# Patient Record
Sex: Male | Born: 1990 | Race: Black or African American | Hispanic: No | Marital: Single | Smoking: Former smoker
Health system: Southern US, Community
[De-identification: ages and names within clinical notes are randomized; demographics above are authoritative.]

## PROBLEM LIST (undated history)

## (undated) DIAGNOSIS — I10 Essential (primary) hypertension: Secondary | ICD-10-CM

## (undated) HISTORY — PX: ANKLE SURGERY: SHX546

---
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
## 2009-02-13 ENCOUNTER — Emergency Department (HOSPITAL_COMMUNITY): Admission: EM | Admit: 2009-02-13 | Discharge: 2009-02-13 | Payer: Self-pay | Admitting: Emergency Medicine

## 2009-12-31 ENCOUNTER — Emergency Department (HOSPITAL_COMMUNITY): Admission: EM | Admit: 2009-12-31 | Discharge: 2009-12-31 | Payer: Self-pay | Admitting: Emergency Medicine

## 2010-07-07 ENCOUNTER — Emergency Department (HOSPITAL_COMMUNITY)
Admission: EM | Admit: 2010-07-07 | Discharge: 2010-07-08 | Payer: Self-pay | Source: Home / Self Care | Admitting: Emergency Medicine

## 2010-07-08 LAB — URINALYSIS, ROUTINE W REFLEX MICROSCOPIC
Bilirubin Urine: NEGATIVE
Hgb urine dipstick: NEGATIVE
Ketones, ur: NEGATIVE mg/dL
Nitrite: NEGATIVE
Protein, ur: NEGATIVE mg/dL
Specific Gravity, Urine: 1.028 (ref 1.005–1.030)
Urine Glucose, Fasting: NEGATIVE mg/dL
Urobilinogen, UA: 1 mg/dL (ref 0.0–1.0)
pH: 6 (ref 5.0–8.0)

## 2010-07-08 LAB — COMPREHENSIVE METABOLIC PANEL
ALT: 33 U/L (ref 0–53)
AST: 31 U/L (ref 0–37)
Albumin: 4.8 g/dL (ref 3.5–5.2)
Alkaline Phosphatase: 105 U/L (ref 39–117)
BUN: 12 mg/dL (ref 6–23)
CO2: 32 mEq/L (ref 19–32)
Calcium: 10 mg/dL (ref 8.4–10.5)
Chloride: 98 mEq/L (ref 96–112)
Creatinine, Ser: 1.15 mg/dL (ref 0.4–1.5)
GFR calc Af Amer: >60 mL/min (ref >60)
GFR calc non Af Amer: >60 mL/min (ref >60)
Glucose, Bld: 89 mg/dL (ref 70–99)
Potassium: 4.1 mEq/L (ref 3.5–5.1)
Sodium: 139 mEq/L (ref 135–145)
Total Bilirubin: 3.3 mg/dL — ABNORMAL HIGH (ref 0.3–1.2)
Total Protein: 8.3 g/dL (ref 6.0–8.3)

## 2010-07-08 LAB — POCT CARDIAC MARKERS
CKMB, poc: <1 ng/mL — ABNORMAL LOW (ref 1.0–8.0)
CKMB, poc: <1 ng/mL — ABNORMAL LOW (ref 1.0–8.0)
Myoglobin, poc: 35.9 ng/mL (ref 12–200)
Myoglobin, poc: 34.6 ng/mL (ref 12–200)
Troponin i, poc: <0.05 ng/mL (ref 0.00–0.09)
Troponin i, poc: <0.05 ng/mL (ref 0.00–0.09)

## 2010-07-08 LAB — POCT I-STAT, CHEM 8
BUN: 15 mg/dL (ref 6–23)
Calcium, Ion: 1.18 mmol/L (ref 1.12–1.32)
Chloride: 98 mEq/L (ref 96–112)
Creatinine, Ser: 1.4 mg/dL (ref 0.4–1.5)
Glucose, Bld: 87 mg/dL (ref 70–99)
HCT: 56 % — ABNORMAL HIGH (ref 39.0–52.0)
Hemoglobin: 19 g/dL — ABNORMAL HIGH (ref 13.0–17.0)
Potassium: 3.9 mEq/L (ref 3.5–5.1)
Sodium: 139 mEq/L (ref 135–145)
TCO2: 33 mmol/L (ref 0–100)

## 2010-07-08 LAB — DIFFERENTIAL
Basophils Absolute: 0 10*3/uL (ref 0.0–0.1)
Basophils Relative: 0 % (ref 0–1)
Eosinophils Absolute: 0.1 10*3/uL (ref 0.0–0.7)
Eosinophils Relative: 1 % (ref 0–5)
Lymphocytes Relative: 29 % (ref 12–46)
Lymphs Abs: 2.2 10*3/uL (ref 0.7–4.0)
Monocytes Absolute: 0.6 10*3/uL (ref 0.1–1.0)
Monocytes Relative: 9 % (ref 3–12)
Neutro Abs: 4.6 10*3/uL (ref 1.7–7.7)
Neutrophils Relative %: 61 % (ref 43–77)

## 2010-07-08 LAB — CBC
HCT: 51.2 % (ref 39.0–52.0)
Hemoglobin: 17.6 g/dL — ABNORMAL HIGH (ref 13.0–17.0)
MCH: 32.9 pg (ref 26.0–34.0)
MCHC: 34.4 g/dL (ref 30.0–36.0)
MCV: 95.7 fL (ref 78.0–100.0)
Platelets: 218 10*3/uL (ref 150–400)
RBC: 5.35 MIL/uL (ref 4.22–5.81)
RDW: 13 % (ref 11.5–15.5)
WBC: 7.6 10*3/uL (ref 4.0–10.5)

## 2010-07-08 LAB — RAPID URINE DRUG SCREEN, HOSP PERFORMED
Amphetamines: NOT DETECTED
Barbiturates: NOT DETECTED
Benzodiazepines: NOT DETECTED
Cocaine: NOT DETECTED
Opiates: NOT DETECTED
Tetrahydrocannabinol: NOT DETECTED

## 2010-09-28 LAB — POCT CARDIAC MARKERS
CKMB, poc: <1 ng/mL — ABNORMAL LOW (ref 1.0–8.0)
Myoglobin, poc: 35.7 ng/mL (ref 12–200)
Troponin i, poc: <0.05 ng/mL (ref 0.00–0.09)

## 2010-09-28 LAB — POCT I-STAT, CHEM 8
BUN: 14 mg/dL (ref 6–23)
Calcium, Ion: 1.12 mmol/L (ref 1.12–1.32)
Chloride: 102 mEq/L (ref 96–112)
Creatinine, Ser: 0.7 mg/dL (ref 0.4–1.5)
Glucose, Bld: 111 mg/dL — ABNORMAL HIGH (ref 70–99)
HCT: 46 % (ref 39.0–52.0)
Hemoglobin: 15.6 g/dL (ref 13.0–17.0)
Potassium: 3.7 mEq/L (ref 3.5–5.1)
Sodium: 137 mEq/L (ref 135–145)
TCO2: 23 mmol/L (ref 0–100)

## 2010-10-02 ENCOUNTER — Emergency Department (HOSPITAL_COMMUNITY)
Admission: EM | Admit: 2010-10-02 | Discharge: 2010-10-02 | Disposition: A | Discharge status: Home / Self Care | Payer: No Typology Code available for payment source | Attending: Emergency Medicine | Admitting: Emergency Medicine

## 2010-10-02 DIAGNOSIS — M545 Low back pain, unspecified: Secondary | ICD-10-CM | POA: Insufficient documentation

## 2010-11-08 NOTE — Op Note (Signed)
[place]. [facility]  Patient:    Derek Page, Derek Page                       MRN: [id number] Proc. Date: 08/13/00 Attending:  Cammy Copa, M.D.                           Operative Report  PREOPERATIVE DIAGNOSIS:  Retained hardware, left leg.  POSTOPERATIVE DIAGNOSIS:  Retained hardware, left leg.  OPERATION PERFORMED:  Removal of hardware, left leg.  SURGEON:  Cammy Copa, M.D.  ANESTHESIA:  LMA.  TOURNIQUET TIME:  One hour and 5 minutes at 250 mmHg.  INDICATIONS FOR PROCEDURE:  The patient is an 20 year old child who is about five or six months status post open reduction internal fixation of fibular fracture and Salter-Harris 2 fracture of the distal tibia who presents now for removal of hardware.  DESCRIPTION OF PROCEDURE:  The patient was brought to the operating room where LMA anesthesia was induced.  Preoperative intravenous antibiotics were administered.  The patients left leg was prepped with Betadine and DuraPrep solution and draped in sterile manner and was covered with an [place].  The leg was exsanguinated and elevated.  Tourniquet was inflated. The prior skin incision was utilized.  Skin and subcutaneous tissues were sharply divided. Scar tissue was covering the entire operative field.  Distally ____________ incision periosteum over the fibula was incised.  The plate became visible. Using blunt dissection, the proximal extent of the plate was visualized. Using a small osteotome, the overgrown bone onto the plate at the proximal and distal ends was removed.  These bone fragments were saved. The screws were then visualized and removed.  The plate was then removed without difficulty. The screw and the periosteum on the distal aspect of the fibula was then further elevated and the screw within the tibia was visualized.  This screw was also removed.  Fluoroscopy confirmed the removal of hardware.  The screw holes in the fibula were then  bone grafted.  The incision was then irrigated and closed using 3-0 Vicryl to reapproximate the periosteum over the fibula distally and then the skin was reapproximated using two inverted 3-0 Vicryl sutures and 3-0 nylon sutures to reapproximate the skin edges.  A posterior splint was applied.  The tourniquet was then released.  The patient tolerated the procedure well and was transferred to the recovery room in stable condition. DD:  08/13/00 TD:  08/13/00 Job: [id number]
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
## 2011-03-26 LAB — RAPID STREP SCREEN (MED CTR MEBANE ONLY): Streptococcus, Group A Screen (Direct): NEGATIVE

## 2011-12-09 ENCOUNTER — Encounter (HOSPITAL_COMMUNITY): Payer: Self-pay | Admitting: *Deleted

## 2011-12-09 ENCOUNTER — Emergency Department (HOSPITAL_COMMUNITY)
Admission: EM | Admit: 2011-12-09 | Discharge: 2011-12-10 | Disposition: A | Discharge status: Home / Self Care | Payer: Self-pay | Attending: Emergency Medicine | Admitting: Emergency Medicine

## 2011-12-09 ENCOUNTER — Emergency Department (HOSPITAL_COMMUNITY): Payer: Self-pay

## 2011-12-09 DIAGNOSIS — R0781 Pleurodynia: Secondary | ICD-10-CM

## 2011-12-09 DIAGNOSIS — R42 Dizziness and giddiness: Secondary | ICD-10-CM | POA: Insufficient documentation

## 2011-12-09 DIAGNOSIS — R071 Chest pain on breathing: Secondary | ICD-10-CM | POA: Insufficient documentation

## 2011-12-09 LAB — CBC
HCT: 44 % (ref 39.0–52.0)
Hemoglobin: 15.3 g/dL (ref 13.0–17.0)
MCH: 33 pg (ref 26.0–34.0)
MCHC: 34.8 g/dL (ref 30.0–36.0)
MCV: 94.8 fL (ref 78.0–100.0)
Platelets: 205 10*3/uL (ref 150–400)
RBC: 4.64 MIL/uL (ref 4.22–5.81)
RDW: 13.8 % (ref 11.5–15.5)
WBC: 7.5 10*3/uL (ref 4.0–10.5)

## 2011-12-09 LAB — POCT I-STAT, CHEM 8
BUN: 7 mg/dL (ref 6–23)
Calcium, Ion: 1.22 mmol/L (ref 1.12–1.32)
Chloride: 100 mEq/L (ref 96–112)
Creatinine, Ser: 1 mg/dL (ref 0.50–1.35)
Glucose, Bld: 93 mg/dL (ref 70–99)
HCT: 48 % (ref 39.0–52.0)
Hemoglobin: 16.3 g/dL (ref 13.0–17.0)
Potassium: 3.8 mEq/L (ref 3.5–5.1)
Sodium: 140 mEq/L (ref 135–145)
TCO2: 26 mmol/L (ref 0–100)

## 2011-12-09 MED ORDER — IBUPROFEN 400 MG PO TABS: 400.0000 mg | ORAL_TABLET | Freq: Three times a day (TID) | ORAL | Status: AC | PRN

## 2011-12-09 MED ORDER — MORPHINE SULFATE 4 MG/ML IJ SOLN
4.0000 mg | Freq: Once | INTRAMUSCULAR | Status: AC
  Administered 2011-12-09: 4 mg via INTRAMUSCULAR
  Filled 2011-12-09: qty 1

## 2011-12-09 MED ORDER — ONDANSETRON 4 MG PO TBDP
4.0000 mg | ORAL_TABLET | Freq: Once | ORAL | Status: AC
  Administered 2011-12-09: 4 mg via ORAL
  Filled 2011-12-09: qty 1

## 2011-12-09 NOTE — ED Provider Notes (Signed)
Medical screening examination/treatment/procedure(s) were performed by non-physician practitioner and as supervising physician I was immediately available for consultation/collaboration.  Juliet Rude. Rubin Payor, MD 12/09/11 2355

## 2011-12-09 NOTE — ED Notes (Signed)
Pt reports awakening yesterday a.m. W/ bilat chest heaviness, pt describes the heaviness "felt like my lungs were heavy." Pt admits to hx of palpitations and irregular heartbeat - also experienced an episode of light headedness earlier today. Denies n/v or shortness of breath - pt states pain is increased w/ movement, non-tender to palpation. Skin warm and dry at present, pt in no acute distress.

## 2011-12-09 NOTE — Discharge Instructions (Signed)
Chest Wall Pain Chest wall pain is pain in or around the bones and muscles of your chest. It may take up to 6 weeks to get better. It may take longer if you must stay physically active in your work and activities.  CAUSES  Chest wall pain may happen on its own. However, it may be caused by:  A viral illness like the flu.   Injury.   Coughing.   Exercise.   Arthritis.   Fibromyalgia.   Shingles.  HOME CARE INSTRUCTIONS   Avoid overtiring physical activity. Try not to strain or perform activities that cause pain. This includes any activities using your chest or your abdominal and side muscles, especially if heavy weights are used.   Put ice on the sore area.   Put ice in a plastic bag.   Place a towel between your skin and the bag.   Leave the ice on for 15 to 20 minutes per hour while awake for the first 2 days.   Only take over-the-counter or prescription medicines for pain, discomfort, or fever as directed by your caregiver.  SEEK IMMEDIATE MEDICAL CARE IF:   Your pain increases, or you are very uncomfortable.   You have a fever.   Your chest pain becomes worse.   You have new, unexplained symptoms.   You have nausea or vomiting.   You feel sweaty or lightheaded.   You have a cough with phlegm (sputum), or you cough up blood.  MAKE SURE YOU:   Understand these instructions.   Will watch your condition.   Will get help right away if you are not doing well or get worse.  Document Released: 06/09/2005 Document Revised: 05/29/2011 Document Reviewed: 02/03/2011 Exit[employer] Patient Information 2012 [employer]. 

## 2011-12-09 NOTE — ED Provider Notes (Signed)
History     CSN: [id number]  Arrival date & time 12/09/11  Mikle Bosworth   First MD Initiated Contact with Patient 12/09/11 2113      Chief Complaint  Patient presents with  . Chest Pain    (Consider location/radiation/quality/duration/timing/severity/associated sxs/prior treatment) HPI  Patient presents to the ED with complaints of chest pains since yesterday. He says that his chest feels heavy. He states that someone one told him he had an irregular heart beat. He had an episode of feeling light headed earlier today. What makes the pain worse are large breaths or movement. Which include bending over. He denies external pressure increasing pain. He feels the pain right now but it is "not that bad just about 6/10". Pt is resting comfortably, VSS, NAD.  History reviewed. No pertinent past medical history.  History reviewed. No pertinent past surgical history.  History reviewed. No pertinent family history.  History  Substance Use Topics  . Smoking status: Current Everyday Smoker -- 1.0 packs/day  . Smokeless tobacco: Not on file  . Alcohol Use: Yes     occasionally       Review of Systems   HEENT: denies blurry vision or change in hearing PULMONARY: Denies difficulty breathing and SOB CARDIAC: denies feeling heart palpitations MUSCULOSKELETAL:  denies being unable to ambulate ABDOMEN AL: denies abdominal pain GU: denies loss of bowel or urinary control NEURO: denies numbness and tingling in extremities SKIN: no new rashes PSYCH: patient behavior is normal NECK: Not complaining of neck pain     Allergies  Review of patient's allergies indicates no known allergies.  Home Medications   Current Outpatient Rx  Name Route Sig Dispense Refill  . IBUPROFEN 400 MG PO TABS Oral Take 1 tablet (400 mg total) by mouth every 8 (eight) hours as needed for pain. 30 tablet 0    BP 134/71  Pulse 71  Temp 98.3 F (36.8 C) (Oral)  Resp 20  SpO2 100%  Physical Exam  Nursing  note and vitals reviewed. Constitutional: He appears well-developed and well-nourished. No distress.  HENT:  Head: Normocephalic and atraumatic.  Eyes: Pupils are equal, round, and reactive to light.  Neck: Normal range of motion. Neck supple.  Cardiovascular: Normal rate and regular rhythm.   No murmur heard. Pulmonary/Chest: Effort normal. No respiratory distress. He has no wheezes. He has no rales.  Abdominal: Soft.  Neurological: He is alert.  Skin: Skin is warm and dry.    ED Course  Procedures (including critical care time)   Labs Reviewed  CBC  POCT I-STAT, CHEM 8   Dg Chest 2 View  12/09/2011  *RADIOLOGY REPORT*  Clinical Data: Smoker presenting with chest pain which began yesterday.  CHEST - 2 VIEW  Comparison: Two-view chest x-ray 07/07/2010, 02/13/2009.  Findings: Cardiomediastinal silhouette unremarkable.  Lungs clear. Bronchovascular markings normal.  Pulmonary vascularity normal.  No pleural effusions.  No pneumothorax.  Visualized bony thorax intact.  No significant interval change.  IMPRESSION: Normal and stable examination.  Original Report Authenticated By: Arnell Sieving, M.D.     1. Pleuritic chest pain       MDM     Date: 12/09/2011  Rate: 76  Rhythm: normal sinus rhythm  QRS Axis: normal  Intervals: normal  ST/T Wave abnormalities: nonspecific ST changes  Conduction Disutrbances:none  Narrative Interpretation:   Old EKG Reviewed: unchanged from jan 2012  Patients work-up and clinical exam are negative for concerning pulmonary or cardiac disease. Patient hwill be placed on  Ibuprofen 400mg  TID for 7 days. Will have patient return to the ED as needed.  Pt has been advised of the symptoms that warrant their return to the ED. Patient has voiced understanding and has agreed to follow-up with the PCP or specialist.           Dorthula Matas, PA 12/09/11 2329

## 2013-08-19 ENCOUNTER — Emergency Department (HOSPITAL_COMMUNITY): Payer: Self-pay

## 2013-08-19 ENCOUNTER — Other Ambulatory Visit: Payer: Self-pay

## 2013-08-19 ENCOUNTER — Encounter (HOSPITAL_COMMUNITY): Payer: Self-pay | Admitting: Emergency Medicine

## 2013-08-19 ENCOUNTER — Emergency Department (HOSPITAL_COMMUNITY)
Admission: EM | Admit: 2013-08-19 | Discharge: 2013-08-19 | Disposition: A | Discharge status: Home / Self Care | Payer: Self-pay | Attending: Emergency Medicine | Admitting: Emergency Medicine

## 2013-08-19 DIAGNOSIS — R079 Chest pain, unspecified: Secondary | ICD-10-CM | POA: Insufficient documentation

## 2013-08-19 DIAGNOSIS — F17200 Nicotine dependence, unspecified, uncomplicated: Secondary | ICD-10-CM | POA: Insufficient documentation

## 2013-08-19 MED ORDER — NAPROXEN 500 MG PO TABS
500.0000 mg | ORAL_TABLET | Freq: Two times a day (BID) | ORAL | Status: DC
  Administered 2013-08-19: 500 mg via ORAL
  Filled 2013-08-19: qty 1

## 2013-08-19 MED ORDER — NAPROXEN 500 MG PO TABS: 500.0000 mg | ORAL_TABLET | Freq: Two times a day (BID) | ORAL | Status: DC

## 2013-08-19 NOTE — ED Notes (Signed)
Per pt report: pt c/o chest pain on the left side of his heart that began around 17:00 this evening. Pt reports that the pain has progressively gotten worse. Pt reports having this pain before and dx with palpitations.  Pt denies nausea, SOB, diaphoresis. Pt is limited the left side.  Pt a/o x 4.  Pt seen ambulating from triage to room. Activity does not change the nature of the pain.  Pt did not take any medications for the pain.  Skin warm and dry. NAD noted.

## 2013-08-19 NOTE — ED Provider Notes (Signed)
CSN: [id number]     Arrival date & time 08/19/13  0135 History   First MD Initiated Contact with Patient 08/19/13 0202     Chief Complaint  Patient presents with  . Chest Pain     (Consider location/radiation/quality/duration/timing/severity/associated sxs/prior Treatment) HPI 23 year old male presents to emergency department with complaint of chest pain.  Chest pain, has been intermittent, ongoing for the last several years.  He's been seen in emergency apartment several times for same.  Tonight's chest pain started around 5 PM.  Pain is left-sided.  Somewhat worse with movement.  Patient reports pain is in his heart and in his chest.  He denies any shortness of breath.  No leg swelling, no prolonged immobility, family history or other reason for PE.  He has been advised in the past.  To followup with cardiology as occasionally he feels palpitations, but has not yet done.  This.  He does not have a primary care Dr.  Patient was prescribed ibuprofen at his last ED visit, and he is unable report if this made symptoms better or worse.  He did not take anything for current pain.  He denies any reflux symptoms.  No nausea, vomiting, cough, fevers. History reviewed. No pertinent past medical history. Past Surgical History  Procedure Laterality Date  . Ankle surgery     No family history on file. History  Substance Use Topics  . Smoking status: Current Every Day Smoker -- 0.50 packs/day    Types: Cigarettes  . Smokeless tobacco: Not on file  . Alcohol Use: Yes     Comment: occasionally     Review of Systems  See History of Present Illness; otherwise all other systems are reviewed and negative  Allergies  Review of patient's allergies indicates no known allergies.  Home Medications  No current outpatient prescriptions on file. BP 135/78  Pulse 79  Temp(Src) 98 F (36.7 C) (Oral)  SpO2 98% Physical Exam  Nursing note and vitals reviewed. Constitutional: He is oriented to person,  place, and time. He appears well-developed and well-nourished. No distress.  HENT:  Head: Normocephalic and atraumatic.  Nose: Nose normal.  Mouth/Throat: Oropharynx is clear and moist.  Eyes: Conjunctivae and EOM are normal. Pupils are equal, round, and reactive to light.  Neck: Normal range of motion. Neck supple. No JVD present. No tracheal deviation present. No thyromegaly present.  Cardiovascular: Normal rate, regular rhythm, normal heart sounds and intact distal pulses.  Exam reveals no gallop and no friction rub.   No murmur heard. Pulmonary/Chest: Effort normal and breath sounds normal. No stridor. No respiratory distress. He has no wheezes. He has no rales. He exhibits no tenderness.  Abdominal: Soft. Bowel sounds are normal. He exhibits no distension and no mass. There is no tenderness. There is no rebound and no guarding.  Musculoskeletal: Normal range of motion. He exhibits no edema and no tenderness.  Lymphadenopathy:    He has no cervical adenopathy.  Neurological: He is alert and oriented to person, place, and time. He exhibits normal muscle tone. Coordination normal.  Skin: Skin is warm and dry. No rash noted. No erythema. No pallor.  Psychiatric: He has a normal mood and affect. His behavior is normal. Judgment and thought content normal.    ED Course  Procedures (including critical care time) Labs Review Labs Reviewed - No data to display Imaging Review Dg Chest 2 View  08/19/2013   CLINICAL DATA:  Intermittent anterior left chest pain.  EXAM: CHEST  2  VIEW  COMPARISON:  Chest radiograph performed 12/09/2011  FINDINGS: The lungs are well-aerated and clear. There is no evidence of focal opacification, pleural effusion or pneumothorax.  The heart is borderline normal in size; the mediastinal contour is within normal limits. No acute osseous abnormalities are seen.  IMPRESSION: No acute cardiopulmonary process seen.   Electronically Signed   By: Roanna RaiderJeffery  Chang M.D.   On:  08/19/2013 03:01    EKG Interpretation   None       Date: 08/19/2013  Rate: 81  Rhythm: normal sinus rhythm  QRS Axis: normal  Intervals: normal  ST/T Wave abnormalities: normal  Conduction Disutrbances:none  Narrative Interpretation:   Old EKG Reviewed: unchanged   MDM   Final diagnoses:  Chest pain    23 -year-old male with intermittent chest pain ongoing for years.  No resected for PE.  Identified.  He is not tachypnea or hypoxic.  Pain is not reproducible on palpation.  No ischemic changes noted on EKG.  All labs done over visits a the last 4 years have been negative.  Will check chest x-ray, but feel the patient, most likely has a atypical pleuritic type chest pain of unknown etiology that should be amenable to NSAIDs    Olivia Mackielga M Neilson Oehlert, MD 08/19/13 [phone]

## 2013-08-19 NOTE — ED Notes (Signed)
Bed: WA20 Expected date:  Expected time:  Means of arrival:  Comments: 

## 2013-08-19 NOTE — Discharge Instructions (Signed)
Your workup today has not shown a specific cause for your pain.  It is okay to take Tylenol, Ibuprofen or Naprosyn, also known as Aleve, if you are having pains in your chest.  Given your past history, your family history and your prior workups, you are at low risk for having heart related chest pain.   Chest Pain (Nonspecific) It is often hard to give a specific diagnosis for the cause of chest pain. There is always a chance that your pain could be related to something serious, such as a heart attack or a blood clot in the lungs. You need to follow up with your caregiver for further evaluation. CAUSES   Heartburn.  Pneumonia or bronchitis.  Anxiety or stress.  Inflammation around your heart (pericarditis) or lung (pleuritis or pleurisy).  A blood clot in the lung.  A collapsed lung (pneumothorax). It can develop suddenly on its own (spontaneous pneumothorax) or from injury (trauma) to the chest.  Shingles infection (herpes zoster virus). The chest wall is composed of bones, muscles, and cartilage. Any of these can be the source of the pain.  The bones can be bruised by injury.  The muscles or cartilage can be strained by coughing or overwork.  The cartilage can be affected by inflammation and become sore (costochondritis). DIAGNOSIS  Lab tests or other studies, such as X-rays, electrocardiography, stress testing, or cardiac imaging, may be needed to find the cause of your pain.  TREATMENT   Treatment depends on what may be causing your chest pain. Treatment may include:  Acid blockers for heartburn.  Anti-inflammatory medicine.  Pain medicine for inflammatory conditions.  Antibiotics if an infection is present.  You may be advised to change lifestyle habits. This includes stopping smoking and avoiding alcohol, caffeine, and chocolate.  You may be advised to keep your head raised (elevated) when sleeping. This reduces the chance of acid going backward from your stomach into  your esophagus.  Most of the time, nonspecific chest pain will improve within 2 to 3 days with rest and mild pain medicine. HOME CARE INSTRUCTIONS   If antibiotics were prescribed, take your antibiotics as directed. Finish them even if you start to feel better.  For the next few days, avoid physical activities that bring on chest pain. Continue physical activities as directed.  Do not smoke.  Avoid drinking alcohol.  Only take over-the-counter or prescription medicine for pain, discomfort, or fever as directed by your caregiver.  Follow your caregiver's suggestions for further testing if your chest pain does not go away.  Keep any follow-up appointments you made. If you do not go to an appointment, you could develop lasting (chronic) problems with pain. If there is any problem keeping an appointment, you must call to reschedule. SEEK MEDICAL CARE IF:   You think you are having problems from the medicine you are taking. Read your medicine instructions carefully.  Your chest pain does not go away, even after treatment.  You develop a rash with blisters on your chest. SEEK IMMEDIATE MEDICAL CARE IF:   You have increased chest pain or pain that spreads to your arm, neck, jaw, back, or abdomen.  You develop shortness of breath, an increasing cough, or you are coughing up blood.  You have severe back or abdominal pain, feel nauseous, or vomit.  You develop severe weakness, fainting, or chills.  You have a fever. THIS IS AN EMERGENCY. Do not wait to see if the pain will go away. Get medical help  at once. Call your local emergency services (911 in U.S.). Do not drive yourself to the hospital. MAKE SURE YOU:   Understand these instructions.  Will watch your condition.  Will get help right away if you are not doing well or get worse. Document Released: 03/19/2005 Document Revised: 09/01/2011 Document Reviewed: 01/13/2008 [facility] Patient Information 2014 [place], [place].  Pain of  Unknown Etiology (Pain Without a Known Cause) You have come to your caregiver because of pain. Pain can occur in any part of the body. Often there is not a definite cause. If your laboratory (blood or urine) work was normal and X-rays or other studies were normal, your caregiver may treat you without knowing the cause of the pain. An example of this is the headache. Most headaches are diagnosed by taking a history. This means your caregiver asks you questions about your headaches. Your caregiver determines a treatment based on your answers. Usually testing done for headaches is normal. Often testing is not done unless there is no response to medications. Regardless of where your pain is located today, you can be given medications to make you comfortable. If no physical cause of pain can be found, most cases of pain will gradually leave as suddenly as they came.  If you have a painful condition and no reason can be found for the pain, it is important that you follow up with your caregiver. If the pain becomes worse or does not go away, it may be necessary to repeat tests and look further for a possible cause.  Only take over-the-counter or prescription medicines for pain, discomfort, or fever as directed by your caregiver.  For the protection of your privacy, test results cannot be given over the phone. Make sure you receive the results of your test. Ask how these results are to be obtained if you have not been informed. It is your responsibility to obtain your test results.  You may continue all activities unless the activities cause more pain. When the pain lessens, it is important to gradually resume normal activities. Resume activities by beginning slowly and gradually increasing the intensity and duration of the activities or exercise. During periods of severe pain, bed rest may be helpful. Lie or sit in any position that is comfortable.  Ice used for acute (sudden) conditions may be effective. Use a  large plastic bag filled with ice and wrapped in a towel. This may provide pain relief.  See your caregiver for continued problems. Your caregiver can help or refer you for exercises or physical therapy if necessary. If you were given medications for your condition, do not drive, operate machinery or power tools, or sign legal documents for 24 hours. Do not drink alcohol, take sleeping pills, or take other medications that may interfere with treatment. See your caregiver immediately if you have pain that is becoming worse and not relieved by medications. Document Released: 03/04/2001 Document Revised: 03/30/2013 Document Reviewed: 06/09/2005 [facility] Patient Information 2014 [place], [place].    Emergency Department Resource Guide 1) Find a Doctor and Pay Out of Pocket Although you won't have to find out who is covered by your insurance plan, it is a good idea to ask around and get recommendations. You will then need to call the office and see if the doctor you have chosen will accept you as a new patient and what types of options they offer for patients who are self-pay. Some doctors offer discounts or will set up payment plans for their patients who  do not have insurance, but you will need to ask so you aren't surprised when you get to your appointment.  2) Contact Your Local Health Department Not all health departments have doctors that can see patients for sick visits, but many do, so it is worth a call to see if yours does. If you don't know where your local health department is, you can check in your phone book. The CDC also has a tool to help you locate your state's health department, and many state websites also have listings of all of their local health departments.  3) Find a Walk-in Clinic If your illness is not likely to be very severe or complicated, you may want to try a walk in clinic. These are popping up all over the country in pharmacies, drugstores, and shopping centers. They're  usually staffed by nurse practitioners or physician assistants that have been trained to treat common illnesses and complaints. They're usually fairly quick and inexpensive. However, if you have serious medical issues or chronic medical problems, these are probably not your best option.  No Primary Care Doctor: - Call Health Connect at  [phone] - they can help you locate a primary care doctor that  accepts your insurance, provides certain services, etc. - Physician Referral Service- [phone]  Chronic Pain Problems: Organization         Address  Phone   Notes  [facility]  [phone] Patients need to be referred by their primary care doctor.   Medication Assistance: Organization         Address  Phone   Notes  [facility] [address] [place], [place] [address] [phone] --Must be a resident of [place] -- Must have NO insurance coverage whatsoever (no Medicaid/ Medicare, etc.) -- The pt. MUST have a primary care doctor that directs their care regularly and follows them in the community   [facility]  [phone]   [employer]  [phone]    Agencies that provide inexpensive medical care: Organization         Address  Phone   Notes  [facility]  [phone]   [facility]    [phone]   [facility] [address] [place], [place] [address] [phone]   [facility] [address], [place] [phone]   [facility]anned Parenthood    [phone]   [facility]    [phone]   [facility]  [address], [place] Phone:  [phone], Fax:  [phone] Hours of Operation:  9 am - 6 pm, M-F.  Also accepts Medicaid/Medicare and self-pay.  [facility]  [address], [place] Phone: [phone], Fax: [phone]. Hours  of Operation:  8:30 am - 5:30 pm, M-F.  Also accepts Medicaid and self-pay.  [facility] [place] [address], [place] Phone: [phone]   [facility] [address], [place], [place] [phone], Ext. [phone] Mondays & Thursdays: 7-9 AM.  First 15 patients are seen on a first come, first serve basis.    Medicaid-accepting [facility] Providers:  Organization         Address  Phone   Notes  [facility] [address], [place] [phone] Also accepts self-pay patients.  [facility]  [address] Laurell Josephs [place], [place]  [phone]   [facility] [address], [place] [phone]   [facility] [address], [place] [phone]   Renaye Rakers [address], [place]   [phone] Only accepts [place] Access [place] patients after they have their name applied to their card.   Self-Pay (no insurance) in [facility]:  Organization         Address  Phone   Notes  Sickle Cell Patients, [facility] [address] [place], [place] [phone]   [facility] [address] [place], [place] [phone]   [facility] [place]  [address], [place] [phone]   [facility]/Dr. Osei-Bonsu  [address] [place] or [address], [place] [phone] Phone number for both [place] and [place] locations is the same.  [facility] [address], [place] [phone]   [facility] [address], [place] or [address] [phone]   [facility] [address], [place] [phone], phone; [phone], fax Sees patients 1st and 3rd Saturday of every month.  Must not qualify for public or private insurance (i.e. Medicaid,  Medicare, [place] Health Choice, Veterans' Benefits)  Household income should be no more than 200% of the poverty level The clinic cannot treat you if you are pregnant or think you are pregnant  Sexually transmitted diseases are not treated at the clinic.    Dental Care: Organization         Address  Phone  Notes  [facility] [address] [place], [place] [phone] Accepts children up to age 28 who are enrolled in [place] or [place] Health Choice; pregnant women with a Medicaid card; and children who have applied for Medicaid or [place] Health Choice, but were declined, whose parents can pay a reduced fee at time of service.  [facility]  [address], [place] [phone] Accepts children up to age 69 who are enrolled in [place] or [place] Health Choice; pregnant women with a Medicaid card; and children who have applied for Medicaid or [place] Health Choice, but were declined, whose parents can pay a reduced fee at time of service.  [facility]  [address] [place], [place] [phone] Patients are seen by appointment only. Walk-ins are not accepted. [facility] will see patients 64 years of age and older. Monday - Tuesday (8am-5pm) Most Wednesdays (8:30-5pm) $30 per visit, cash only  [facility]  [address], [facility] [phone] Patients are seen by appointment only. Walk-ins are not accepted. [facility] will see patients 50 years of age and older. One Wednesday Evening (Monthly: Volunteer Based).  $30 per visit, cash only  [employer]  [phone] for adults; Children under age 65, call Graduate Pediatric Dentistry at [phone]. Children aged 39-14, please call [phone] to request a pediatric application.  Dental services are provided in all areas of dental care including fillings, crowns  and bridges, complete and partial dentures, implants, gum treatment, root canals, and extractions. Preventive care is also provided. Treatment is provided to both adults and children. Patients  are selected via a lottery and there is often a waiting list.   [facility] [address] [place]  [phone] [email or web address]   [facility] [address] [place], [place] [phone], Ext. [phone] Second and Fourth Thursday of each month, opens at 6:30 AM; Clinic ends at 9 AM.  Patients are seen on a first-come first-served basis, and a limited number are seen during each clinic.   [facility]  [address] Ether Griffins [place], [place] [phone]   Eligibility Requirements You must have lived in [place], [place], or [place] counties for at least the last three months.   You cannot be eligible for state or federal sponsored [place], including [employer], [place], or [employer].   You generally cannot be eligible for healthcare insurance through your employer.    How to apply: Eligibility screenings are held every Tuesday and Wednesday afternoon from 1:00 pm until 4:00 pm. You do not need an appointment for the interview!  [facility] [address] [place], [place] [phone]   [facility] Health Department  [phone]   [facility] Health Department  [phone]   [facility] Health Department  [phone]    Behavioral Health Resources in the Community: Intensive Outpatient Programs Organization         Address  Phone  Notes  [facility] Services [address] [place], [place] [phone]   [facility] [address], [place], [place] [phone]   [facility] [address], [place], [place]  [phone]   [facility] [address]  [place], [place] [phone] or [phone]   Substance Abuse  Resources Organization         Address  Phone  Notes  [facility] Drug Services  [phone]   [facility]  [phone]   [facility] [place]  [phone]   Floydene Flock  [phone]   Residential & Outpatient Substance Abuse Program  [phone]   Psychological Services Organization         Address  Phone  Notes  [facility]  [phone]   [facility]  [phone]   [facility] [address], [place] [phone] or [phone]    Mobile Crisis Teams Organization         Address  Phone  Notes  [facility], Mobile Crisis Care Unit  [phone]   [facility]  [address] [place], [place] [phone]   Doristine Locks [address] [place] [phone]    Self-Help/Support Groups Organization         Address  Phone             Notes  [facility] [place] - variety of support groups  [phone]- I7437963 Call for more information  Narcotics Anonymous (NA), [facility] [address], [employer] [place]  2 meetings at this location   Statistician         Address  Phone  Notes  [facility] [address],    [place]  [phone]   [facility]  [address] [place] [address], [place], [place] [phone]   [facility] [address] [place], [place] [phone] Admissions: 8am-3pm M-F  [facility] [address],    [place], [place] [phone]   [facility] [address] Leonard Schwartz [place], [place] [phone]   [facility] [address],  [place], [place] [phone]   [facility] - Intensive Outpatient [address] Laurell Josephs [address], [place], [place] [phone]   [facility] ([facility] [address] [place].,  [place], [place] [phone] or [phone]   [facility] ([facility]) [address], [place], [place] [phone] Accepts Medicaid  [facility] [address]  [place] [phone] Substance Abuse/Addiction Treatment   [facility] Organization         Address  Phone  Notes  [facility]  [phone]   Angie Fava, PhD [address] Ervin [place], [place]   [phone] or [phone]   [facility] Behavioral   [address] [place], [place] [phone]   [facility] [address], [place], [place] [phone] Insurance/Medicaid/sponsorship through [facility] and Families [address]                                    [place], [place] [phone] Therapy/tele-psych/case  [facility] [address], [place] [phone]    Dr. Lolly Mustache  [phone]   [facility]  [employer] [facility] Dept. 1) [address] [place] 2) [address] [place] 3)  [address], [place] [phone]  [phone]   [facility] Child Abuse Hotline 442-710-6534 or [phone] (After Hours)

## 2016-02-23 ENCOUNTER — Encounter (HOSPITAL_COMMUNITY): Payer: Self-pay | Admitting: Emergency Medicine

## 2016-02-23 ENCOUNTER — Emergency Department (HOSPITAL_COMMUNITY)
Admission: EM | Admit: 2016-02-23 | Discharge: 2016-02-23 | Disposition: A | Discharge status: Home / Self Care | Payer: Self-pay | Attending: Emergency Medicine | Admitting: Emergency Medicine

## 2016-02-23 ENCOUNTER — Emergency Department (HOSPITAL_COMMUNITY): Payer: Self-pay

## 2016-02-23 DIAGNOSIS — Y999 Unspecified external cause status: Secondary | ICD-10-CM | POA: Insufficient documentation

## 2016-02-23 DIAGNOSIS — Z791 Long term (current) use of non-steroidal anti-inflammatories (NSAID): Secondary | ICD-10-CM | POA: Insufficient documentation

## 2016-02-23 DIAGNOSIS — Y9389 Activity, other specified: Secondary | ICD-10-CM | POA: Insufficient documentation

## 2016-02-23 DIAGNOSIS — F17210 Nicotine dependence, cigarettes, uncomplicated: Secondary | ICD-10-CM | POA: Insufficient documentation

## 2016-02-23 DIAGNOSIS — S61411A Laceration without foreign body of right hand, initial encounter: Secondary | ICD-10-CM | POA: Insufficient documentation

## 2016-02-23 DIAGNOSIS — Y929 Unspecified place or not applicable: Secondary | ICD-10-CM | POA: Insufficient documentation

## 2016-02-23 DIAGNOSIS — W268XXA Contact with other sharp object(s), not elsewhere classified, initial encounter: Secondary | ICD-10-CM | POA: Insufficient documentation

## 2016-02-23 MED ORDER — LIDOCAINE HCL 1 % IJ SOLN
5.0000 mL | Freq: Once | INTRAMUSCULAR | Status: AC
  Administered 2016-02-23: 5 mL
  Filled 2016-02-23: qty 20

## 2016-02-23 MED ORDER — LIDOCAINE HCL (PF) 1 % IJ SOLN: 5.0000 mL | Freq: Once | INTRAMUSCULAR | Status: DC

## 2016-02-23 NOTE — ED Provider Notes (Signed)
[facility]-EMERGENCY DEPT Provider Note   CSN: [id number] Arrival date & time: 02/23/16  1957  By signing my name below, I, Rosario Adie, attest that this documentation has been prepared under the direction and in the presence of [facility], PA-C.  Electronically Signed: Rosario Adie, ED Scribe. 02/23/16. 8:27 PM.  History   Chief Complaint Chief Complaint  Patient presents with  . Extremity Laceration   The history is provided by the patient. No language interpreter was used.   HPI Comments: Lincon Sahlin is a right-hand dominant 25 y.o. male with no pertinent PMhx, who presents to the Emergency Department s/p laceration sustained to the dorsal aspect of the right hand between the third and fourth digits ~3 hours ago. Bleeding is controlled with pressure. Pt reports that he was drilling a metal box onto a ceiling when his hand pushed forward into a sharp sheet metal edge between the two digits, sustaining his laceration. Denies numbness or weakness. Denies any other injury.  Pt is not currently on anticoagulant therapy. Tetanus is UTD.    No past medical history on file.  There are no active problems to display for this patient.  Past Surgical History:  Procedure Laterality Date  . ANKLE SURGERY      Home Medications    Prior to Admission medications   Medication Sig Start Date End Date Taking? Authorizing Provider  naproxen (NAPROSYN) 500 MG tablet Take 1 tablet (500 mg total) by mouth 2 (two) times daily with a meal. 08/19/13   Marisa Severin, MD   Family History No family history on file.  Social History Social History  Substance Use Topics  . Smoking status: Current Every Day Smoker    Packs/day: 0.50    Types: Cigarettes  . Smokeless tobacco: Never Used  . Alcohol use Yes     Comment: occasionally    Allergies   Review of patient's allergies indicates no known allergies.  Review of Systems Review of Systems  Constitutional: Negative for fever.    Musculoskeletal: Negative for arthralgias.  Skin: Positive for wound. Negative for color change.  Allergic/Immunologic: Negative for immunocompromised state.  Neurological: Negative for weakness and numbness.  Hematological: Does not bruise/bleed easily.  Psychiatric/Behavioral: Negative for self-injury (accidental ).   Physical Exam Updated Vital Signs BP 141/84 (BP Location: Left Arm)   Pulse 83   Temp 98.8 F (37.1 C) (Oral)   Resp 18   Ht 5\' 7"  (1.702 m)   Wt 68 kg   SpO2 97%   BMI 23.49 kg/m   Physical Exam  Constitutional: He appears well-developed and well-nourished.  HENT:  Head: Normocephalic and atraumatic.  Neck: Neck supple.  Pulmonary/Chest: Effort normal.  Musculoskeletal: Normal range of motion. He exhibits no edema.  Right dorsal hand with 1.5cm laceration between the third and fourth MCPs. Small amount of active bleeding with movement. Full active range of motion of all digits, strength 5/5, sensation intact, capillary refill < 2 seconds.   Neurological: He is alert.  Skin: Skin is warm. Capillary refill takes less than 2 seconds.     Nursing note and vitals reviewed.  ED Treatments / Results  DIAGNOSTIC STUDIES: Oxygen Saturation is 97% on RA, normal by my interpretation.   COORDINATION OF CARE: 8:25 PM-Discussed next steps with pt including DG right hand. Pt verbalized understanding and is agreeable with the plan.   Radiology Dg Hand Complete Right  Result Date: 02/23/2016 CLINICAL DATA:  Laceration between the third and fourth metacarpal phalangeal  joints on the dorsal surface of the right hand. EXAM: RIGHT HAND - COMPLETE 3+ VIEW COMPARISON:  None. FINDINGS: There is no evidence of fracture or dislocation. There is no evidence of arthropathy or other focal bone abnormality. Mild dorsal soft tissue swelling over the metacarpal phalangeal region. No radiopaque soft tissue foreign bodies or gas collections. IMPRESSION: Mild soft tissue swelling.  No  acute bony abnormalities. Electronically Signed   By: Burman NievesWilliam  Stevens M.D.   On: 02/23/2016 21:04    Procedures Procedures   LACERATION REPAIR Performed by: Trixie DredgeWEST, Pheng Prokop Authorized by: Trixie DredgeWEST, Merit Gadsby Consent: Verbal consent obtained. Risks and benefits: risks, benefits and alternatives were discussed Consent given by: patient Patient identity confirmed: provided demographic data Prepped and Draped in normal sterile fashion Wound explored  Laceration Location: right, dorsal hand between 3rd and 4th MCPs  Laceration Length: 2 cm  No Foreign Bodies seen or palpated  Anesthesia: local infiltration  Local anesthetic: lidocaine 1% w/o epinephrine  Anesthetic total: 3 ml  Irrigation method: syringe Amount of cleaning: standard  Skin closure: 5-0 vicryl  Number of sutures: 6  Technique: simple interrupted  Patient tolerance: Patient tolerated the procedure well with no immediate complications.   Medications Ordered in ED Medications  lidocaine (XYLOCAINE) 1 % (with pres) injection 5 mL (5 mLs Infiltration Given 02/23/16 2041)    Initial Impression / Assessment and Plan / ED Course  I have reviewed the triage vital signs and the nursing notes.  Pertinent labs & imaging results that were available during my care of the patient were reviewed by me and considered in my medical decision making (see chart for details).  Clinical Course   Afebrile nontoxic patient with accidental laceration to the dorsal right hand.  He is right hand dominant.  Xray negative for FB.  Neurovascularly intact.  Doubt tendon injury. Pressure irrigation performed by nurse. Laceration occurred < 8 hours prior to repair which was well tolerated. Pt has no co-morbidities to effect normal wound healing. Discussed suture home care w pt and answered questions. Pt to f-u if needed for wound check and suture removal in 10-14 days.  Sutures are absorbable Pt is hemodynamically stable w/ no complaints prior to d/c.   Discussed result, findings, treatment, and follow up  with patient.  Pt given return precautions.  Pt verbalizes understanding and agrees with plan.      Final Clinical Impressions(s) / ED Diagnoses   Final diagnoses:  Hand laceration, right, initial encounter    New Prescriptions New Prescriptions   No medications on file   I personally performed the services described in this documentation, which was scribed in my presence. The recorded information has been reviewed and is accurate.    Trixie Dredgemily Ion Gonnella, PA-C 02/23/16 2148    Pricilla LovelessScott Goldston, MD 02/24/16 [phone]

## 2016-02-23 NOTE — Discharge Instructions (Signed)
Read the information below.  You may return to the Emergency Department at any time for worsening condition or any new symptoms that concern you.  If you develop redness, swelling, pus draining from the wound, or fevers greater than 100.4, return to the ER immediately for a recheck.   °

## 2016-02-23 NOTE — ED Triage Notes (Signed)
Pt was working with sheet metal and cut his hand between his middle and ring finger on his right hand near the knuckle.  Bleeding controlled.

## 2018-01-28 ENCOUNTER — Other Ambulatory Visit: Payer: Self-pay

## 2018-01-28 ENCOUNTER — Emergency Department (HOSPITAL_COMMUNITY): Payer: Self-pay

## 2018-01-28 ENCOUNTER — Encounter (HOSPITAL_COMMUNITY): Payer: Self-pay

## 2018-01-28 ENCOUNTER — Emergency Department (HOSPITAL_COMMUNITY)
Admission: EM | Admit: 2018-01-28 | Discharge: 2018-01-28 | Disposition: A | Discharge status: Home / Self Care | Payer: Self-pay | Attending: Emergency Medicine | Admitting: Emergency Medicine

## 2018-01-28 DIAGNOSIS — Z87891 Personal history of nicotine dependence: Secondary | ICD-10-CM | POA: Insufficient documentation

## 2018-01-28 DIAGNOSIS — R0789 Other chest pain: Secondary | ICD-10-CM | POA: Insufficient documentation

## 2018-01-28 LAB — CBC
HEMATOCRIT: 43.7 % (ref 39.0–52.0)
HEMOGLOBIN: 14.8 g/dL (ref 13.0–17.0)
MCH: 31.8 pg (ref 26.0–34.0)
MCHC: 33.9 g/dL (ref 30.0–36.0)
MCV: 93.8 fL (ref 78.0–100.0)
Platelets: 221 10*3/uL (ref 150–400)
RBC: 4.66 MIL/uL (ref 4.22–5.81)
RDW: 13.3 % (ref 11.5–15.5)
WBC: 7.8 10*3/uL (ref 4.0–10.5)

## 2018-01-28 LAB — POCT I-STAT TROPONIN I
TROPONIN I, POC: 0 ng/mL (ref 0.00–0.08)
Troponin i, poc: 0.04 ng/mL (ref 0.00–0.08)

## 2018-01-28 LAB — BASIC METABOLIC PANEL
ANION GAP: 10 (ref 5–15)
BUN: 11 mg/dL (ref 6–20)
CHLORIDE: 101 mmol/L (ref 98–111)
CO2: 28 mmol/L (ref 22–32)
CREATININE: 0.84 mg/dL (ref 0.61–1.24)
Calcium: 9.1 mg/dL (ref 8.9–10.3)
GFR calc non Af Amer: >60 mL/min (ref >60)
Glucose, Bld: 169 mg/dL — ABNORMAL HIGH (ref 70–99)
POTASSIUM: 3.8 mmol/L (ref 3.5–5.1)
Sodium: 139 mmol/L (ref 135–145)

## 2018-01-28 NOTE — ED Triage Notes (Signed)
Pt states he has been having left sided chest pressure "for a couple of weeks", as well as his "head has been feeling funny".

## 2018-01-28 NOTE — ED Notes (Signed)
Manual BP 147/110. Pt verbalizes that BP was elevated recently at 130/113. Pt advise to follow up with his PCP regarding elevated BP.

## 2018-01-28 NOTE — ED Provider Notes (Signed)
[facility] DEPT Provider Note   CSN: [id number] Arrival date & time: 01/28/18  1524     History   Chief Complaint Chief Complaint  Patient presents with  . Chest Pain    HPI Derek Page is a 27 y.o. male no significant past medical history presents emergency department today for chest pain.  Patient reports that 2 weeks ago he started getting a "funny feeling in my chest".  He reports that the sensation in his chest is on the left side and he describes it as a "funny feeling" and pressure.  He reports that this typically occurs approximately 10 times per day and lasts for ~1 minutes. The pain never lasts longer then 1 minute. He reports that it occurs at rest but never with exertion. The patient notes his pain is not pleuritic or positional in nature.  He notes no associated shortness of breath, nausea, emesis, diaphoresis with this.  He does not radiate to his neck, jaw, back, abdomen, or either shoulder/either arm.  He reports at times he does have a "funny feeling in my head" with this but he denies any headache, visual changes, photophobia, phonophobia, vertigo, facial droop, numbness/tingling/weakness of the extremities.  No head trauma reported.  Patient is unable to describe his symptoms.  Patient reports he is able to exert himself by mowing a full lawn with a push mower without any chest pain since his symptoms onset.  Patient has not been taking anything for symptoms.  He denies any burping or belching with this.  He notes that nothing makes his symptoms better or worse. Denies risk factors for PE including exogenous estrogen use, recent surgery or travel, trauma, immobilization, previous blood clot, cough, hemoptysis, cancer, lower extremity pain or swelling, or family history of bleeding/clotting disorder. No fevers at home. Patient reports that he quit smoking in December. No illicit drug use including but not limited to cocaine use.  Patient reports no  family history of CAD.  He denies personal history of MI, CVA, TIA.  No prior echocardiogram, stress test or heart catheterization.  Patient is currently chest pain-free.   HPI  History reviewed. No pertinent past medical history.  There are no active problems to display for this patient.   Past Surgical History:  Procedure Laterality Date  . ANKLE SURGERY          Home Medications    Prior to Admission medications   Medication Sig Start Date End Date Taking? Authorizing Provider  naproxen (NAPROSYN) 500 MG tablet Take 1 tablet (500 mg total) by mouth 2 (two) times daily with a meal. Patient not taking: Reported on 01/28/2018 08/19/13   Marisa Severin, MD    Family History No family history on file.  Social History Social History   Tobacco Use  . Smoking status: Former Smoker    Packs/day: 0.50    Types: Cigarettes    Last attempt to quit: 06/30/2017    Years since quitting: 0.5  . Smokeless tobacco: Never Used  Substance Use Topics  . Alcohol use: Yes    Comment: occasionally- every other weekend  . Drug use: No     Allergies   Patient has no known allergies.   Review of Systems Review of Systems  All other systems reviewed and are negative.    Physical Exam Updated Vital Signs BP (!) 146/94 (BP Location: Left Arm)   Pulse 70   Temp 98.8 F (37.1 C) (Oral)   Resp 16  Ht 5\' 8"  (1.727 m)   Wt 70.3 kg   SpO2 98%   BMI 23.57 kg/m   Physical Exam  Constitutional: He appears well-developed and well-nourished.  HENT:  Head: Normocephalic and atraumatic.  Right Ear: External ear normal.  Left Ear: External ear normal.  Nose: Nose normal.  Mouth/Throat: Uvula is midline, oropharynx is clear and moist and mucous membranes are normal. No tonsillar exudate.  Eyes: Pupils are equal, round, and reactive to light. Right eye exhibits no discharge. Left eye exhibits no discharge. No scleral icterus.  Neck: Trachea normal. Neck supple. No JVD present. No spinous  process tenderness present. Carotid bruit is not present. No neck rigidity. Normal range of motion present.  No nuchal rigidity or meningismus  Cardiovascular: Normal rate, regular rhythm and intact distal pulses.  No murmur heard. Pulses:      Radial pulses are 2+ on the right side, and 2+ on the left side.       Dorsalis pedis pulses are 2+ on the right side, and 2+ on the left side.       Posterior tibial pulses are 2+ on the right side, and 2+ on the left side.  No lower extremity swelling or edema. Calves symmetric in size bilaterally.  Pulmonary/Chest: Effort normal and breath sounds normal. He exhibits no tenderness.  Abdominal: Soft. Bowel sounds are normal. There is no tenderness. There is no rebound and no guarding.  Musculoskeletal: He exhibits no edema.  Lymphadenopathy:    He has no cervical adenopathy.  Neurological: He is alert.  Speech clear. Follows commands. No facial droop. PERRLA. EOMI. Normal peripheral fields. CN III-XII intact.  Grossly moves all extremities 4 without ataxia. Coordination intact. Able and appropriate strength for age to upper and lower extremities bilaterally including grip strength & plantar flexion/dorsiflexion. Sensation to light touch intact bilaterally for upper and lower. Patellar deep tendon reflex 2+ and equal bilaterally. Normal finger to nose. No pronator drift. Normal gait.   Skin: Skin is warm and dry. No rash noted. He is not diaphoretic.  Psychiatric: He has a normal mood and affect.  Nursing note and vitals reviewed.    ED Treatments / Results  Labs (all labs ordered are listed, but only abnormal results are displayed) Labs Reviewed  BASIC METABOLIC PANEL - Abnormal; Notable for the following components:      Result Value   Glucose, Bld 169 (*)    All other components within normal limits  CBC  POCT I-STAT TROPONIN I  I-STAT TROPONIN, ED  POCT I-STAT TROPONIN I    EKG EKG Interpretation  Date/Time:  Thursday January 28 2018 15:35:00 EDT Ventricular Rate:  77 PR Interval:    QRS Duration: 76 QT Interval:  361 QTC Calculation: 409 R Axis:   56 Text Interpretation:  Sinus rhythm no acute ST/T changes no significant change since Feb 2015 Confirmed by Pricilla Loveless [phone]) on 01/28/2018 10:44:09 PM   Radiology Dg Chest 2 View  Result Date: 01/28/2018 CLINICAL DATA:  Chest pain and shortness of breath. EXAM: CHEST - 2 VIEW COMPARISON:  Chest x-ray dated August 19, 2013. FINDINGS: Stable borderline cardiomegaly. Normal pulmonary vascularity. No focal consolidation, pleural effusion, or pneumothorax. No acute osseous abnormality. IMPRESSION: No active cardiopulmonary disease. Electronically Signed   By: Obie Dredge M.D.   On: 01/28/2018 16:14    Procedures Procedures (including critical care time)  Medications Ordered in ED Medications - No data to display   Initial Impression / Assessment and  Plan / ED Course  I have reviewed the triage vital signs and the nursing notes.  Pertinent labs & imaging results that were available during my care of the patient were reviewed by me and considered in my medical decision making (see chart for details).     Patient presented with chest pain to the ED. Patient is to be discharged with recommendation to follow up with PCP in regards to today's hospital visit. Chest pain is not likely of cardiac or pulmonary etiology due to presentation, perc negative, stable vital signs, no tracheal deviation, no JVD or new murmur, RRR, breath sounds equal bilaterally, EKG without acute abnormalities, negative troponin x 2, and negative CXR. HEART score is low risk.  Symptoms are atypical for dissection or esophageal rupture.  Patient has been advised to return to the ED if chest pain becomes exertional, associated with diaphoresis or nausea, radiates to left jaw/arm, worsens or becomes concerning in any way. Patient appears reliable for follow up and is agreeable to discharge. I  advised the patient to follow-up with their primary care provider this week. Patient also reported funny feeling in his head.  He denies any neurologic symptoms and has normal neurologic exam.  No neck pain reported.  No carotid bruit auscultated.  Do not suspect dissection at this time.  No fever.  No meningeal signs.  Do not suspect meningitis.  Do not suspect SAH, ICH or temporal arteritis.  No reported change in vision.  No further work-up indicated. The evaluation does not show pathology that would require ongoing emergent intervention or inpatient treatment. I advised the patient to follow-up with PCP this week. I advised the patient to return to the emergency department with new or worsening symptoms or new concerns. Specific return precautions discussed. The patient verbalized understanding and agreement with plan. All questions answered. No further questions at this time. The patient is hemodynamically stable, mentating appropriately and appears safe for discharge.  Final Clinical Impressions(s) / ED Diagnoses   Final diagnoses:  Atypical chest pain    ED Discharge Orders    None       Princella PellegriniMaczis, Michael M, PA-C 01/28/18 2249    Linwood DibblesKnapp, Jon, MD 01/29/18 [phone]

## 2018-01-28 NOTE — Discharge Instructions (Addendum)
Read instructions below for reasons to return to the Emergency Department. It is recommended that your follow up with your Primary Care Doctor in regards to today's visit. If you do not have a doctor, use the resource guide listed below to help you find one.  Begin taking over the counter Prilosec or Zegrid (omeprazole) as directed.   Tests performed today include: An EKG of your heart A chest x-ray Cardiac enzymes - a blood test for heart muscle damage x 2 Blood counts and electrolytes Vital signs. See below for your results today.   Chest Pain (Nonspecific)  HOME CARE INSTRUCTIONS  For the next few days, avoid physical activities that bring on chest pain. Continue physical activities as directed.  Do not smoke cigarettes or drink alcohol until your symptoms are gone. If you do smoke, it is time to quit. You may receive instructions and counseling on how to stop smoking. Only take over-the-counter or prescription medicine for pain, discomfort, or fever as directed by your caregiver.  Follow your caregiver's suggestions for further testing if your chest pain does not go away.  Keep any follow-up appointments you made. If you do not go to an appointment, you could develop lasting (chronic) problems with pain. If there is any problem keeping an appointment, you must call to reschedule.  SEEK MEDICAL CARE IF:  You think you are having problems from the medicine you are taking. Read your medicine instructions carefully.  Your chest pain does not go away, even after treatment.  You develop a rash with blisters on your chest.  SEEK IMMEDIATE MEDICAL CARE IF:  You have increased chest pain or pain that spreads to your arm, neck, jaw, back, or belly (abdomen).  You develop shortness of breath, an increasing cough, or you are coughing up blood.  You have severe back or abdominal pain, feel sick to your stomach (nauseous) or throw up (vomit).  You develop severe weakness, fainting, or chills.  You have  an oral temperature above 102 F (38.9 C), not controlled by medicine.  THIS IS AN EMERGENCY. Do not wait to see if the pain will go away. Get medical help at once. Call your local emergency services (911 in [place] Do not drive yourself to the hospital. Additional Information:  Your vital signs today were: BP (!) 146/94 (BP Location: Left Arm)    Pulse 70    Temp 98.8 F (37.1 C) (Oral)    Resp 16    Ht 5\' 8"  (1.727 m)    Wt 70.3 kg    SpO2 98%    BMI 23.57 kg/m  If your blood pressure (BP) was elevated above 135/85 this visit, please have this repeated by your doctor within one month. ---------------

## 2018-02-12 ENCOUNTER — Emergency Department (HOSPITAL_COMMUNITY)
Admission: EM | Admit: 2018-02-12 | Discharge: 2018-02-13 | Disposition: A | Discharge status: Home / Self Care | Payer: Self-pay | Attending: Emergency Medicine | Admitting: Emergency Medicine

## 2018-02-12 ENCOUNTER — Encounter (HOSPITAL_COMMUNITY): Payer: Self-pay | Admitting: *Deleted

## 2018-02-12 ENCOUNTER — Other Ambulatory Visit: Payer: Self-pay

## 2018-02-12 ENCOUNTER — Emergency Department (HOSPITAL_COMMUNITY): Payer: Self-pay

## 2018-02-12 DIAGNOSIS — Z5321 Procedure and treatment not carried out due to patient leaving prior to being seen by health care provider: Secondary | ICD-10-CM | POA: Insufficient documentation

## 2018-02-12 DIAGNOSIS — R0789 Other chest pain: Secondary | ICD-10-CM | POA: Insufficient documentation

## 2018-02-12 HISTORY — DX: Essential (primary) hypertension: I10

## 2018-02-12 NOTE — ED Triage Notes (Signed)
The pt is c/o chest pain for one month [facility] no reason was found for the pain no sob no distress

## 2018-02-13 NOTE — ED Notes (Signed)
Pt to the desk, stated he is going home and will return in the morning.

## 2018-02-15 NOTE — ED Notes (Signed)
Follow up call attempted  Unable to contact pt  02/15/18  1209  s Mayar Whittier rn

## 2018-03-03 ENCOUNTER — Ambulatory Visit: Payer: Self-pay | Attending: Nurse Practitioner | Admitting: Nurse Practitioner

## 2018-03-03 ENCOUNTER — Encounter: Payer: Self-pay | Admitting: Nurse Practitioner

## 2018-03-03 VITALS — BP 136/90 | HR 79 | Temp 98.9°F | Ht 69.0 in | Wt 176.8 lb

## 2018-03-03 DIAGNOSIS — I10 Essential (primary) hypertension: Secondary | ICD-10-CM | POA: Insufficient documentation

## 2018-03-03 DIAGNOSIS — R0789 Other chest pain: Secondary | ICD-10-CM | POA: Insufficient documentation

## 2018-03-03 MED ORDER — RANITIDINE HCL 300 MG PO TABS: 300.0000 mg | ORAL_TABLET | Freq: Every day | ORAL | 1 refills | Status: DC

## 2018-03-03 NOTE — Patient Instructions (Signed)
Generalized Anxiety Disorder, Adult Generalized anxiety disorder (GAD) is a mental health disorder. People with this condition constantly worry about everyday events. Unlike normal anxiety, worry related to GAD is not triggered by a specific event. These worries also do not fade or get better with time. GAD interferes with life functions, including relationships, work, and school. GAD can vary from mild to severe. People with severe GAD can have intense waves of anxiety with physical symptoms (panic attacks). What are the causes? The exact cause of GAD is not known. What increases the risk? This condition is more likely to develop in:  Women.  People who have a family history of anxiety disorders.  People who are very shy.  People who experience very stressful life events, such as the death of a loved one.  People who have a very stressful family environment.  What are the signs or symptoms? People with GAD often worry excessively about many things in their lives, such as their health and family. They may also be overly concerned about:  Doing well at work.  Being on time.  Natural disasters.  Friendships.  Physical symptoms of GAD include:  Fatigue.  Muscle tension or having muscle twitches.  Trembling or feeling shaky.  Being easily startled.  Feeling like your heart is pounding or racing.  Feeling out of breath or like you cannot take a deep breath.  Having trouble falling asleep or staying asleep.  Sweating.  Nausea, diarrhea, or irritable bowel syndrome (IBS).  Headaches.  Trouble concentrating or remembering facts.  Restlessness.  Irritability.  How is this diagnosed? Your health care provider can diagnose GAD based on your symptoms and medical history. You will also have a physical exam. The health care provider will ask specific questions about your symptoms, including how severe they are, when they started, and if they come and go. Your health care  provider may ask you about your use of alcohol or drugs, including prescription medicines. Your health care provider may refer you to a mental health specialist for further evaluation. Your health care provider will do a thorough examination and may perform additional tests to rule out other possible causes of your symptoms. To be diagnosed with GAD, a person must have anxiety that:  Is out of his or her control.  Affects several different aspects of his or her life, such as work and relationships.  Causes distress that makes him or her unable to take part in normal activities.  Includes at least three physical symptoms of GAD, such as restlessness, fatigue, trouble concentrating, irritability, muscle tension, or sleep problems.  Before your health care provider can confirm a diagnosis of GAD, these symptoms must be present more days than they are not, and they must last for six months or longer. How is this treated? The following therapies are usually used to treat GAD:  Medicine. Antidepressant medicine is usually prescribed for long-term daily control. Antianxiety medicines may be added in severe cases, especially when panic attacks occur.  Talk therapy (psychotherapy). Certain types of talk therapy can be helpful in treating GAD by providing support, education, and guidance. Options include: ? Cognitive behavioral therapy (CBT). People learn coping skills and techniques to ease their anxiety. They learn to identify unrealistic or negative thoughts and behaviors and to replace them with positive ones. ? Acceptance and commitment therapy (ACT). This treatment teaches people how to be mindful as a way to cope with unwanted thoughts and feelings. ? Biofeedback. This process trains you to   you to manage your body's response (physiological response) through breathing techniques and relaxation methods. You will work with a therapist while machines are used to monitor your physical symptoms.  Stress  management techniques. These include yoga, meditation, and exercise.  A mental health specialist can help determine which treatment is best for you. Some people see improvement with one type of therapy. However, other people require a combination of therapies. Follow these instructions at home:  Take over-the-counter and prescription medicines only as told by your health care provider.  Try to maintain a normal routine.  Try to anticipate stressful situations and allow extra time to manage them.  Practice any stress management or self-calming techniques as taught by your health care provider.  Do not punish yourself for setbacks or for not making progress.  Try to recognize your accomplishments, even if they are small.  Keep all follow-up visits as told by your health care provider. This is important. Contact a health care provider if:  Your symptoms do not get better.  Your symptoms get worse.  You have signs of depression, such as: ? A persistently sad, cranky, or irritable mood. ? Loss of enjoyment in activities that used to bring you joy. ? Change in weight or eating. ? Changes in sleeping habits. ? Avoiding friends or family members. ? Loss of energy for normal tasks. ? Feelings of guilt or worthlessness. Get help right away if:  You have serious thoughts about hurting yourself or others. If you ever feel like you may hurt yourself or others, or have thoughts about taking your own life, get help right away. You can go to your nearest emergency department or call:  Your local emergency services (911 in the [place]  A suicide crisis helpline, such as the National Suicide Prevention Lifeline at 1-800-273-8255. This is open 24 hours a day.  Summary  Generalized anxiety disorder (GAD) is a mental health disorder that involves worry that is not triggered by a specific event.  People with GAD often worry excessively about many things in their lives, such as their health and  family.  GAD may cause physical symptoms such as restlessness, trouble concentrating, sleep problems, frequent sweating, nausea, diarrhea, headaches, and trembling or muscle twitching.  A mental health specialist can help determine which treatment is best for you. Some people see improvement with one type of therapy. However, other people require a combination of therapies. This information is not intended to replace advice given to you by your health care provider. Make sure you discuss any questions you have with your health care provider. Document Released: 10/04/2012 Document Revised: 04/29/2016 Document Reviewed: 04/29/2016 Elsevier Interactive Patient Education  2018 [employer]      Living With Anxiety  After being diagnosed with an anxiety disorder, you may be relieved to know why you have felt or behaved a certain way. It is natural to also feel overwhelmed about the treatment ahead and what it will mean for your life. With care and support, you can manage this condition and recover from it. How to cope with anxiety Dealing with stress Stress is your body's reaction to life changes and events, both good and bad. Stress can last just a few hours or it can be ongoing. Stress can play a major role in anxiety, so it is important to learn both how to cope with stress and how to think about it differently. Talk with your health care provider or a counselor to learn more about stress reduction. He   or she may suggest some stress reduction techniques, such as:  Music therapy. This can include creating or listening to music that you enjoy and that inspires you.  Mindfulness-based meditation. This involves being aware of your normal breaths, rather than trying to control your breathing. It can be done while sitting or walking.  Centering prayer. This is a kind of meditation that involves focusing on a word, phrase, or sacred image that is meaningful to you and that brings you peace.  Deep  breathing. To do this, expand your stomach and inhale slowly through your nose. Hold your breath for 3-5 seconds. Then exhale slowly, allowing your stomach muscles to relax.  Self-talk. This is a skill where you identify thought patterns that lead to anxiety reactions and correct those thoughts.  Muscle relaxation. This involves tensing muscles then relaxing them.  Choose a stress reduction technique that fits your lifestyle and personality. Stress reduction techniques take time and practice. Set aside 5-15 minutes a day to do them. Therapists can offer training in these techniques. The training may be covered by some insurance plans. Other things you can do to manage stress include:  Keeping a stress diary. This can help you learn what triggers your stress and ways to control your response.  Thinking about how you respond to certain situations. You may not be able to control everything, but you can control your reaction.  Making time for activities that help you relax, and not feeling guilty about spending your time in this way.  Therapy combined with coping and stress-reduction skills provides the best chance for successful treatment. Medicines Medicines can help ease symptoms. Medicines for anxiety include:  Anti-anxiety drugs.  Antidepressants.  Beta-blockers.  Medicines may be used as the main treatment for anxiety disorder, along with therapy, or if other treatments are not working. Medicines should be prescribed by a health care provider. Relationships Relationships can play a big part in helping you recover. Try to spend more time connecting with trusted friends and family members. Consider going to couples counseling, taking family education classes, or going to family therapy. Therapy can help you and others better understand the condition. How to recognize changes in your condition Everyone has a different response to treatment for anxiety. Recovery from anxiety happens when  symptoms decrease and stop interfering with your daily activities at home or work. This may mean that you will start to:  Have better concentration and focus.  Sleep better.  Be less irritable.  Have more energy.  Have improved memory.  It is important to recognize when your condition is getting worse. Contact your health care provider if your symptoms interfere with home or work and you do not feel like your condition is improving. Where to find help and support: You can get help and support from these sources:  Self-help groups.  Online and community organizations.  A trusted spiritual leader.  Couples counseling.  Family education classes.  Family therapy.  Follow these instructions at home:  Eat a healthy diet that includes plenty of vegetables, fruits, whole grains, low-fat dairy products, and lean protein. Do not eat a lot of foods that are high in solid fats, added sugars, or salt.  Exercise. Most adults should do the following: ? Exercise for at least 150 minutes each week. The exercise should increase your heart rate and make you sweat (moderate-intensity exercise). ? Strengthening exercises at least twice a week.  Cut down on caffeine, tobacco, alcohol, and other potentially harmful substances.    sleep. Most adults need 7-9 hours of sleep each night.  Make choices that simplify your life.  Take over-the-counter and prescription medicines only as told by your health care provider.  Avoid caffeine, alcohol, and certain over-the-counter cold medicines. These may make you feel worse. Ask your pharmacist which medicines to avoid.  Keep all follow-up visits as told by your health care provider. This is important. Questions to ask your health care provider  Would I benefit from therapy?  How often should I follow up with a health care provider?  How long do I need to take medicine?  Are there any long-term side effects of my  medicine?  Are there any alternatives to taking medicine? Contact a health care provider if:  You have a hard time staying focused or finishing daily tasks.  You spend many hours a day feeling worried about everyday life.  You become exhausted by worry.  You start to have headaches, feel tense, or have nausea.  You urinate more than normal.  You have diarrhea. Get help right away if:  You have a racing heart and shortness of breath.  You have thoughts of hurting yourself or others. If you ever feel like you may hurt yourself or others, or have thoughts about taking your own life, get help right away. You can go to your nearest emergency department or call:  Your local emergency services (911 in the [place]  A suicide crisis helpline, such as the National Suicide Prevention Lifeline at (718)750-0990. This is open 24-hours a day.  Summary  Taking steps to deal with stress can help calm you.  Medicines cannot cure anxiety disorders, but they can help ease symptoms.  Family, friends, and partners can play a big part in helping you recover from an anxiety disorder. This information is not intended to replace advice given to you by your health care provider. Make sure you discuss any questions you have with your health care provider. Document Released: 06/03/2016 Document Revised: 06/03/2016 Document Reviewed: 06/03/2016 Elsevier Interactive Patient Education  2018 [employer].  Panic Attacks Panic attacks are sudden, short feelings of great fear or discomfort. You may have them for no reason when you are relaxed, when you are uneasy (anxious), or when you are sleeping. Follow these instructions at home:  Take all your medicines as told.  Check with your doctor before starting new medicines.  Keep all doctor visits. Contact a doctor if:  You are not able to take your medicines as told.  Your symptoms do not get better.  Your symptoms get worse. Get help right away  if:  Your attacks seem different than your normal attacks.  You have thoughts about hurting yourself or others.  You take panic attack medicine and you have a side effect. This information is not intended to replace advice given to you by your health care provider. Make sure you discuss any questions you have with your health care provider. Document Released: 07/12/2010 Document Revised: 11/15/2015 Document Reviewed: 01/21/2013 Elsevier Interactive Patient Education  2017 Else[employer] Inc.  Heartburn Heartburn is a type of pain or discomfort that can happen in the throat or chest. It is often described as a burning pain. It may also cause a bad taste in the mouth. Heartburn may feel worse when you lie down or bend over. It may be caused by stomach contents that move back up (reflux) into the tube that connects the mouth with the stomach (esophagus). Follow these instructions at home: Take these  actions to lessen your discomfort and to help avoid problems. Diet  Follow a diet as told by your doctor. You may need to avoid foods and drinks such as: ? Coffee and tea (with or without caffeine). ? Drinks that contain alcohol. ? Energy drinks and sports drinks. ? Carbonated drinks or sodas. ? Chocolate and cocoa. ? Peppermint and mint flavorings. ? Garlic and onions. ? Horseradish. ? Spicy and acidic foods, such as peppers, chili powder, curry powder, vinegar, hot sauces, and BBQ sauce. ? Citrus fruit juices and citrus fruits, such as oranges, lemons, and limes. ? Tomato-based foods, such as red sauce, chili, salsa, and pizza with red sauce. ? Fried and fatty foods, such as donuts, french fries, potato chips, and high-fat dressings. ? High-fat meats, such as hot dogs, rib eye steak, sausage, ham, and bacon. ? High-fat dairy items, such as whole milk, butter, and cream cheese.  Eat small meals often. Avoid eating large meals.  Avoid drinking large amounts of liquid with your meals.  Avoid  eating meals during the 2-3 hours before bedtime.  Avoid lying down right after you eat.  Do not exercise right after you eat. General instructions  Pay attention to any changes in your symptoms.  Take over-the-counter and prescription medicines only as told by your doctor. Do not take aspirin, ibuprofen, or other NSAIDs unless your doctor says it is okay.  Do not use any tobacco products, including cigarettes, chewing tobacco, and e-cigarettes. If you need help quitting, ask your doctor.  Wear loose clothes. Do not wear anything tight around your waist.  Raise (elevate) the head of your bed about 6 inches (15 cm).  Try to lower your stress. If you need help doing this, ask your doctor.  If you are overweight, lose an amount of weight that is healthy for you. Ask your doctor about a safe weight loss goal.  Keep all follow-up visits as told by your doctor. This is important. Contact a doctor if:  You have new symptoms.  You lose weight and you do not know why it is happening.  You have trouble swallowing, or it hurts to swallow.  You have wheezing or a cough that keeps happening.  Your symptoms do not get better with treatment.  You have heartburn often for more than two weeks. Get help right away if:  You have pain in your arms, neck, jaw, teeth, or back.  You feel sweaty, dizzy, or light-headed.  You have chest pain or shortness of breath.  You throw up (vomit) and your throw up looks like blood or coffee grounds.  Your poop (stool) is bloody or black. This information is not intended to replace advice given to you by your health care provider. Make sure you discuss any questions you have with your health care provider. Document Released: 02/19/2011 Document Revised: 11/15/2015 Document Reviewed: 10/04/2014 Elsevier Interactive Patient Education  [employer].

## 2018-03-03 NOTE — Progress Notes (Signed)
Assessment & Plan:  Derek Page was seen today for hospitalization follow-up.  Diagnoses and all orders for this visit:  Other chest pain -     ranitidine (ZANTAC) 300 MG tablet; Take 1 tablet (300 mg total) by mouth at bedtime.    Patient has been counseled on age-appropriate routine health concerns for screening and prevention. These are reviewed and up-to-date. Referrals have been placed accordingly. Immunizations are up-to-date or declined.    Subjective:   Chief Complaint  Patient presents with  . Hospitalization Follow-up    Pt. is here for hospital follow-up. Pt. stated he still have sharp pain that radiates to from his heart to the middle of his chest. Pt. stated he also been having light headiness.    HPI Derek Page 27 y.o. male presents to office today to establish care. He has complaints of right sided chest pain. He has been seen in the ED on multiple occasions: most recently on 01-28-2018 and 02-12-2018 for chest pain with negative work up including: EKG, C Xray and lab work. He denies any family history of sudden cardiac death.   Today he continues to endorse chest pain that radiates from left chest to epigastric area. There are no aggravating factors and no relieving factors. Chest pain goes away on its own. Since his cardiac workup has been essentially normal I think it's best to rule out any GI abnormalities. Will start with H2 antagonist. If GI work up is negative will need to evaluate for mental health related issues: panic attacks, anxiety, etc.     Review of Systems  Constitutional: Negative for fever, malaise/fatigue and weight loss.  HENT: Negative.  Negative for nosebleeds.   Eyes: Negative.  Negative for blurred vision, double vision and photophobia.  Respiratory: Positive for shortness of breath. Negative for cough.   Cardiovascular: Positive for chest pain. Negative for palpitations and leg swelling.  Gastrointestinal: Negative.  Negative for heartburn,  nausea and vomiting.  Musculoskeletal: Negative.  Negative for myalgias.  Neurological: Positive for dizziness. Negative for focal weakness, seizures and headaches.  Psychiatric/Behavioral: Negative.  Negative for suicidal ideas.    Past Medical History:  Diagnosis Date  . Hypertension     Past Surgical History:  Procedure Laterality Date  . ANKLE SURGERY      Family History  Problem Relation Age of Onset  . Hypertension Mother     Social History Reviewed with no changes to be made today.   Outpatient Medications Prior to Visit  Medication Sig Dispense Refill  . naproxen (NAPROSYN) 500 MG tablet Take 1 tablet (500 mg total) by mouth 2 (two) times daily with a meal. (Patient not taking: Reported on 01/28/2018) 60 tablet 0   No facility-administered medications prior to visit.     No Known Allergies     Objective:    BP 136/90 (BP Location: Left Arm, Patient Position: Sitting, Cuff Size: Normal)   Pulse 79   Temp 98.9 F (37.2 C) (Oral)   Ht 5\' 9"  (1.753 m)   Wt 176 lb 12.8 oz (80.2 kg)   SpO2 95%   BMI 26.11 kg/m  Wt Readings from Last 3 Encounters:  03/03/18 176 lb 12.8 oz (80.2 kg)  02/12/18 170 lb (77.1 kg)  01/28/18 155 lb (70.3 kg)    Physical Exam  Constitutional: He is oriented to person, place, and time. He appears well-developed and well-nourished. He is cooperative.  HENT:  Head: Normocephalic and atraumatic.  Eyes: EOM are normal.  Neck: Normal  range of motion.  Cardiovascular: Normal rate, regular rhythm and normal heart sounds. Exam reveals no gallop and no friction rub.  No murmur heard. Pulmonary/Chest: Effort normal and breath sounds normal. No tachypnea. No respiratory distress. He has no decreased breath sounds. He has no wheezes. He has no rhonchi. He has no rales. He exhibits no tenderness.  Abdominal: Bowel sounds are normal.  Musculoskeletal: Normal range of motion. He exhibits no edema.  Neurological: He is alert and oriented to person,  place, and time. Coordination normal.  Skin: Skin is warm and dry.  Psychiatric: He has a normal mood and affect. His speech is normal and behavior is normal. Judgment and thought content normal. Cognition and memory are normal. He expresses no homicidal and no suicidal ideation. He expresses no suicidal plans and no homicidal plans.  Nursing note and vitals reviewed.      Patient has been counseled extensively about nutrition and exercise as well as the importance of adherence with medications and regular follow-up. The patient was given clear instructions to go to ER or return to medical center if symptoms don't improve, worsen or new problems develop. The patient verbalized understanding.   Follow-up: Return in about 4 weeks (around 03/31/2018) for chest pain.   Claiborne Rigg, FNP-BC [facility] [place], [place] [phone]   03/04/2018, 10:41 PM

## 2018-03-04 ENCOUNTER — Encounter: Payer: Self-pay | Admitting: Nurse Practitioner

## 2018-03-04 ENCOUNTER — Other Ambulatory Visit: Payer: Self-pay

## 2018-03-05 ENCOUNTER — Other Ambulatory Visit: Payer: Self-pay | Admitting: Nurse Practitioner

## 2018-03-05 MED ORDER — NAPROXEN 500 MG PO TABS: 500.0000 mg | ORAL_TABLET | Freq: Two times a day (BID) | ORAL | 1 refills | Status: DC

## 2018-04-02 ENCOUNTER — Ambulatory Visit: Payer: Self-pay | Admitting: Nurse Practitioner

## 2018-09-12 ENCOUNTER — Emergency Department (HOSPITAL_COMMUNITY)
Admission: EM | Admit: 2018-09-12 | Discharge: 2018-09-12 | Disposition: A | Discharge status: Home / Self Care | Payer: Self-pay | Attending: Emergency Medicine | Admitting: Emergency Medicine

## 2018-09-12 ENCOUNTER — Other Ambulatory Visit: Payer: Self-pay

## 2018-09-12 ENCOUNTER — Encounter (HOSPITAL_COMMUNITY): Payer: Self-pay

## 2018-09-12 DIAGNOSIS — I10 Essential (primary) hypertension: Secondary | ICD-10-CM | POA: Insufficient documentation

## 2018-09-12 DIAGNOSIS — R197 Diarrhea, unspecified: Secondary | ICD-10-CM | POA: Insufficient documentation

## 2018-09-12 DIAGNOSIS — Z87891 Personal history of nicotine dependence: Secondary | ICD-10-CM | POA: Insufficient documentation

## 2018-09-12 MED ORDER — SODIUM CHLORIDE 0.9% FLUSH: 3.0000 mL | Freq: Once | INTRAVENOUS | Status: DC

## 2018-09-12 NOTE — Discharge Instructions (Addendum)
Get plenty of rest and drink a lot of fluids, especially water.  For diarrhea you can use Kaopectate or Imodium.  Follow the instructions attached to get suggestions for dietary control of diarrhea.    Your blood pressure is mildly elevated today and will need follow-up, by your PCP.  In the meantime try to stay on a DASHdiet as listed on the attached paperwork.  Return here, if needed, for problems.

## 2018-09-12 NOTE — ED Provider Notes (Signed)
[facility]-EMERGENCY DEPT Provider Note   CSN: [id number] Arrival date & time: 09/12/18  1447    History   Chief Complaint Chief Complaint  Patient presents with  . Dizziness  . Diarrhea    HPI Derek Page is a 28 y.o. male.     HPI   He presents for evaluation of diarrhea, which started yesterday evening, after eating.  Prior to that he had gone about 11 hours without eating.  Today he ate breakfast without problems, went to work but had some more diarrhea, also he decided come here for evaluation.  He denies fever, chills, nausea, vomiting, weakness or dizziness.  There has been no blood in the stool.  There are no other known modifying factors.  Past Medical History:  Diagnosis Date  . Hypertension     There are no active problems to display for this patient.   Past Surgical History:  Procedure Laterality Date  . ANKLE SURGERY          Home Medications    Prior to Admission medications   Not on File    Family History Family History  Problem Relation Age of Onset  . Hypertension Mother     Social History Social History   Tobacco Use  . Smoking status: Former Smoker    Packs/day: 0.50    Types: Cigarettes    Last attempt to quit: 06/30/2017    Years since quitting: 1.2  . Smokeless tobacco: Never Used  Substance Use Topics  . Alcohol use: Yes    Comment: occasionally- every other weekend  . Drug use: No     Allergies   Patient has no known allergies.   Review of Systems Review of Systems  All other systems reviewed and are negative.    Physical Exam Updated Vital Signs BP (!) 154/109 (BP Location: Right Arm)   Pulse 80   Temp 98.2 F (36.8 C) (Oral)   Resp 16   Ht 5\' 8"  (1.727 m)   Wt 76.7 kg   SpO2 98%   BMI 25.70 kg/m   Physical Exam Vitals signs and nursing note reviewed.  Constitutional:      Appearance: He is well-developed.  HENT:     Head: Normocephalic and atraumatic.     Right Ear:  External ear normal.     Left Ear: External ear normal.  Eyes:     Conjunctiva/sclera: Conjunctivae normal.     Pupils: Pupils are equal, round, and reactive to light.  Neck:     Musculoskeletal: Normal range of motion and neck supple.     Trachea: Phonation normal.  Cardiovascular:     Rate and Rhythm: Normal rate and regular rhythm.     Heart sounds: Normal heart sounds.  Pulmonary:     Effort: Pulmonary effort is normal.     Breath sounds: Normal breath sounds.  Abdominal:     General: There is no distension.     Palpations: Abdomen is soft.     Tenderness: There is no abdominal tenderness. There is no guarding.  Musculoskeletal: Normal range of motion.  Skin:    General: Skin is warm and dry.  Neurological:     Mental Status: He is alert and oriented to person, place, and time.     Cranial Nerves: No cranial nerve deficit.     Sensory: No sensory deficit.     Motor: No abnormal muscle tone.     Coordination: Coordination normal.  Psychiatric:  Mood and Affect: Mood normal.        Behavior: Behavior normal.        Thought Content: Thought content normal.        Judgment: Judgment normal.      ED Treatments / Results  Labs (all labs ordered are listed, but only abnormal results are displayed) Labs Reviewed - No data to display  EKG None  Radiology No results found.  Procedures Procedures (including critical care time)  Medications Ordered in ED Medications - No data to display   Initial Impression / Assessment and Plan / ED Course  I have reviewed the triage vital signs and the nursing notes.  Pertinent labs & imaging results that were available during my care of the patient were reviewed by me and considered in my medical decision making (see chart for details).         Patient Vitals for the past 24 hrs:  BP Temp Temp src Pulse Resp SpO2 Height Weight  09/12/18 1559 (!) 154/109 - - 80 16 98 % - -  09/12/18 1456 - - - - - - 5\' 8"  (1.727 m)  76.7 kg  09/12/18 1452 (!) 172/108 98.2 F (36.8 C) Oral 83 16 96 % - -    3:46 PM Reevaluation with update and discussion. After initial assessment and treatment, an updated evaluation reveals no change in clinical status.  Incidental, mild hypertension, without evidence for hypertensive crisis.  No indication for further ED intervention or treatment, at this time.  Findings discussed with the patient and all questions were answered. Mancel Bale   Medical Decision Making: Nonspecific diarrhea, with reassuring physical examination vital signs.  CRITICAL CARE-no Performed by: Mancel Bale  Nursing Notes Reviewed/ Care Coordinated Applicable Imaging Reviewed Interpretation of Laboratory Data incorporated into ED treatment  The patient appears reasonably screened and/or stabilized for discharge and I doubt any other medical condition or other [facility] requiring further screening, evaluation, or treatment in the ED at this time prior to discharge.  Plan: Home Medications-OTC symptom treatment as needed; Home Treatments-rest, fluids, gradually advance diet; return here if the recommended treatment, does not improve the symptoms; Recommended follow up-PCP follow-up 1 week for checkup and results of her blood pressure, return here if needed.   Final Clinical Impressions(s) / ED Diagnoses   Final diagnoses:  Diarrhea, unspecified type  Hypertension, unspecified type    ED Discharge Orders    None       Mancel Bale, MD 09/12/18 [phone]

## 2018-09-12 NOTE — ED Triage Notes (Signed)
Patient c/o feeling lightheaded yesterday and first thing this AM, but not now. Patient states he has had had 4 diarrheal stools since yesterday. Patient states his job sent him to the Ed to be checked and to get a dr.'s note.

## 2019-11-07 IMAGING — CR DG CHEST 2V
2 series · 2 of 2 positions shown · non-contrast
Comparison: Chest x-ray dated August 19, 2013.

CLINICAL DATA: Chest pain and shortness of breath.

EXAM:
CHEST - 2 VIEW

[w chest pa]
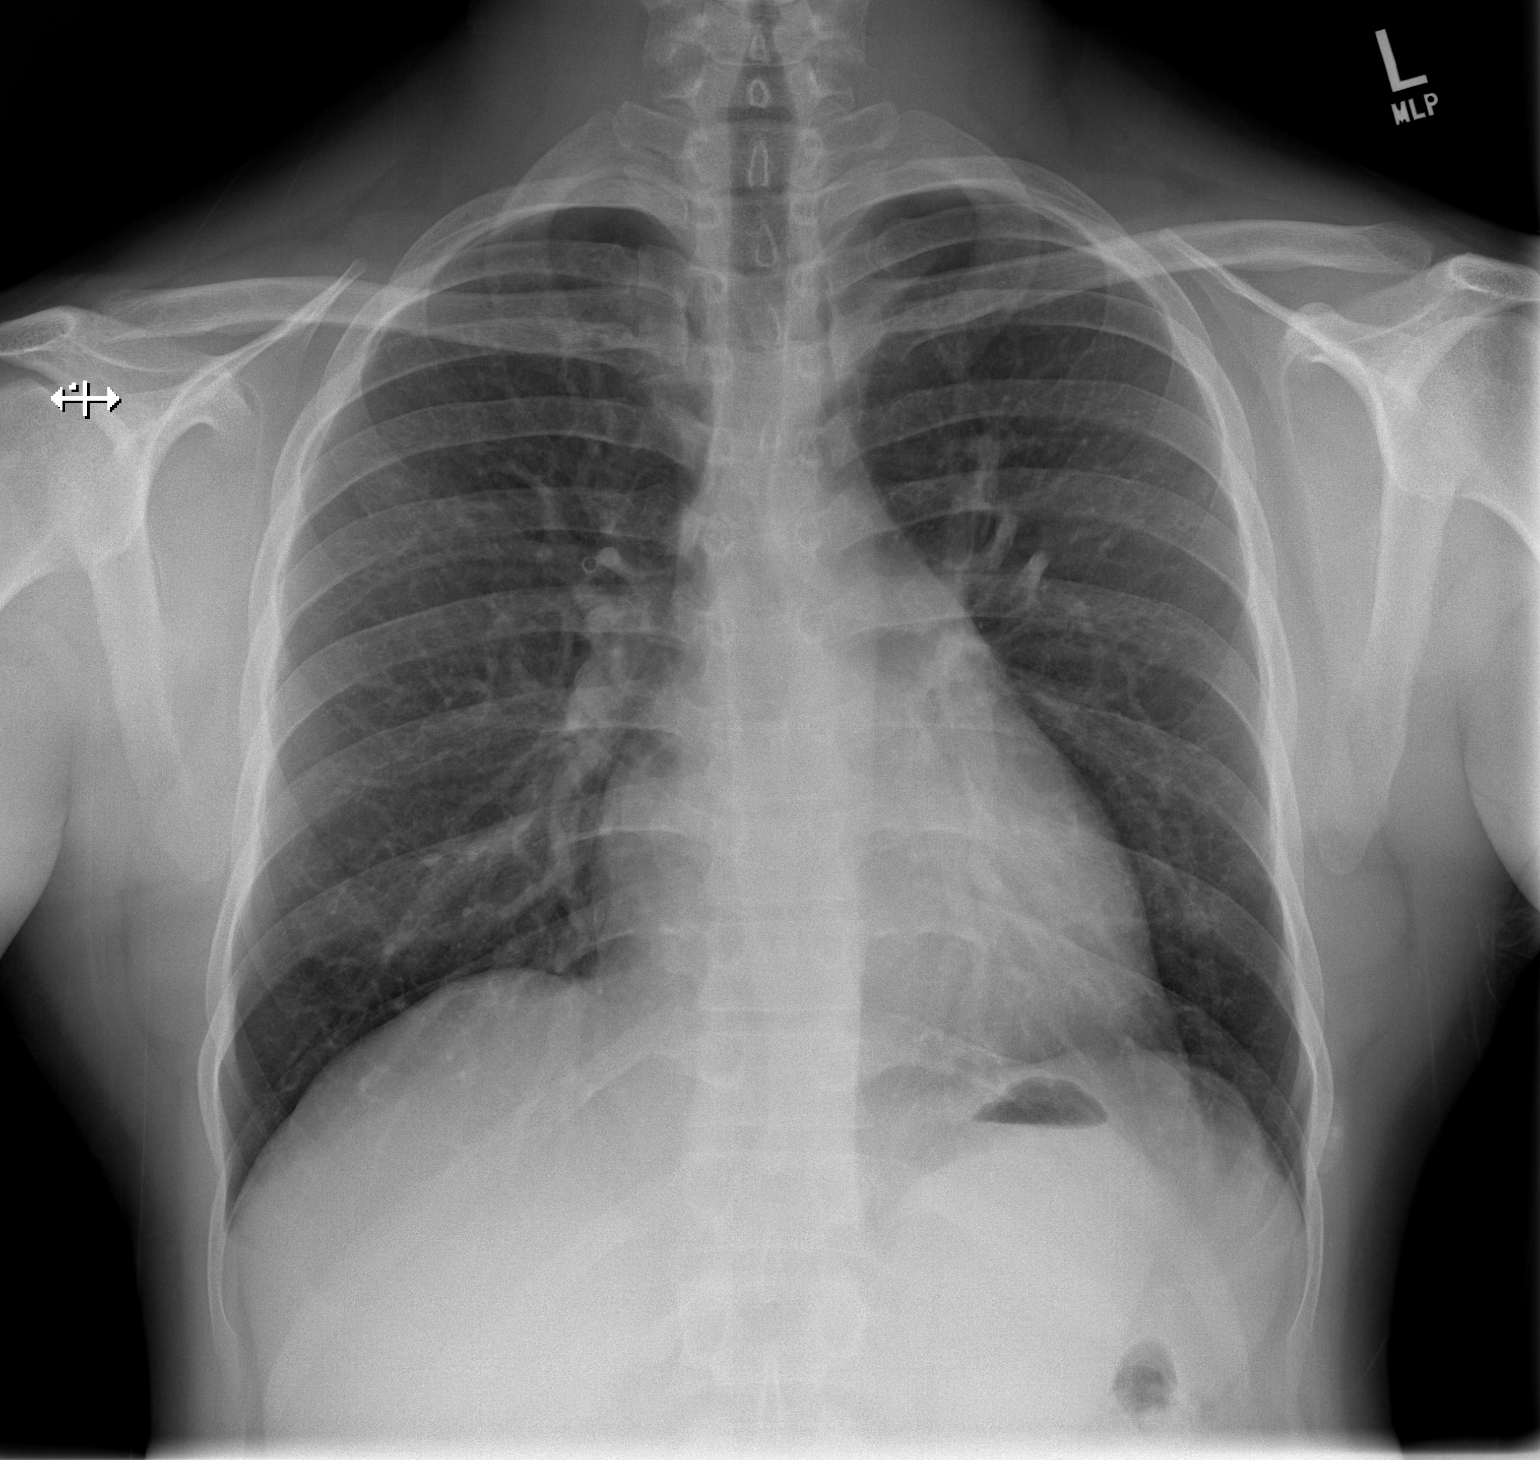

[w chest lat]
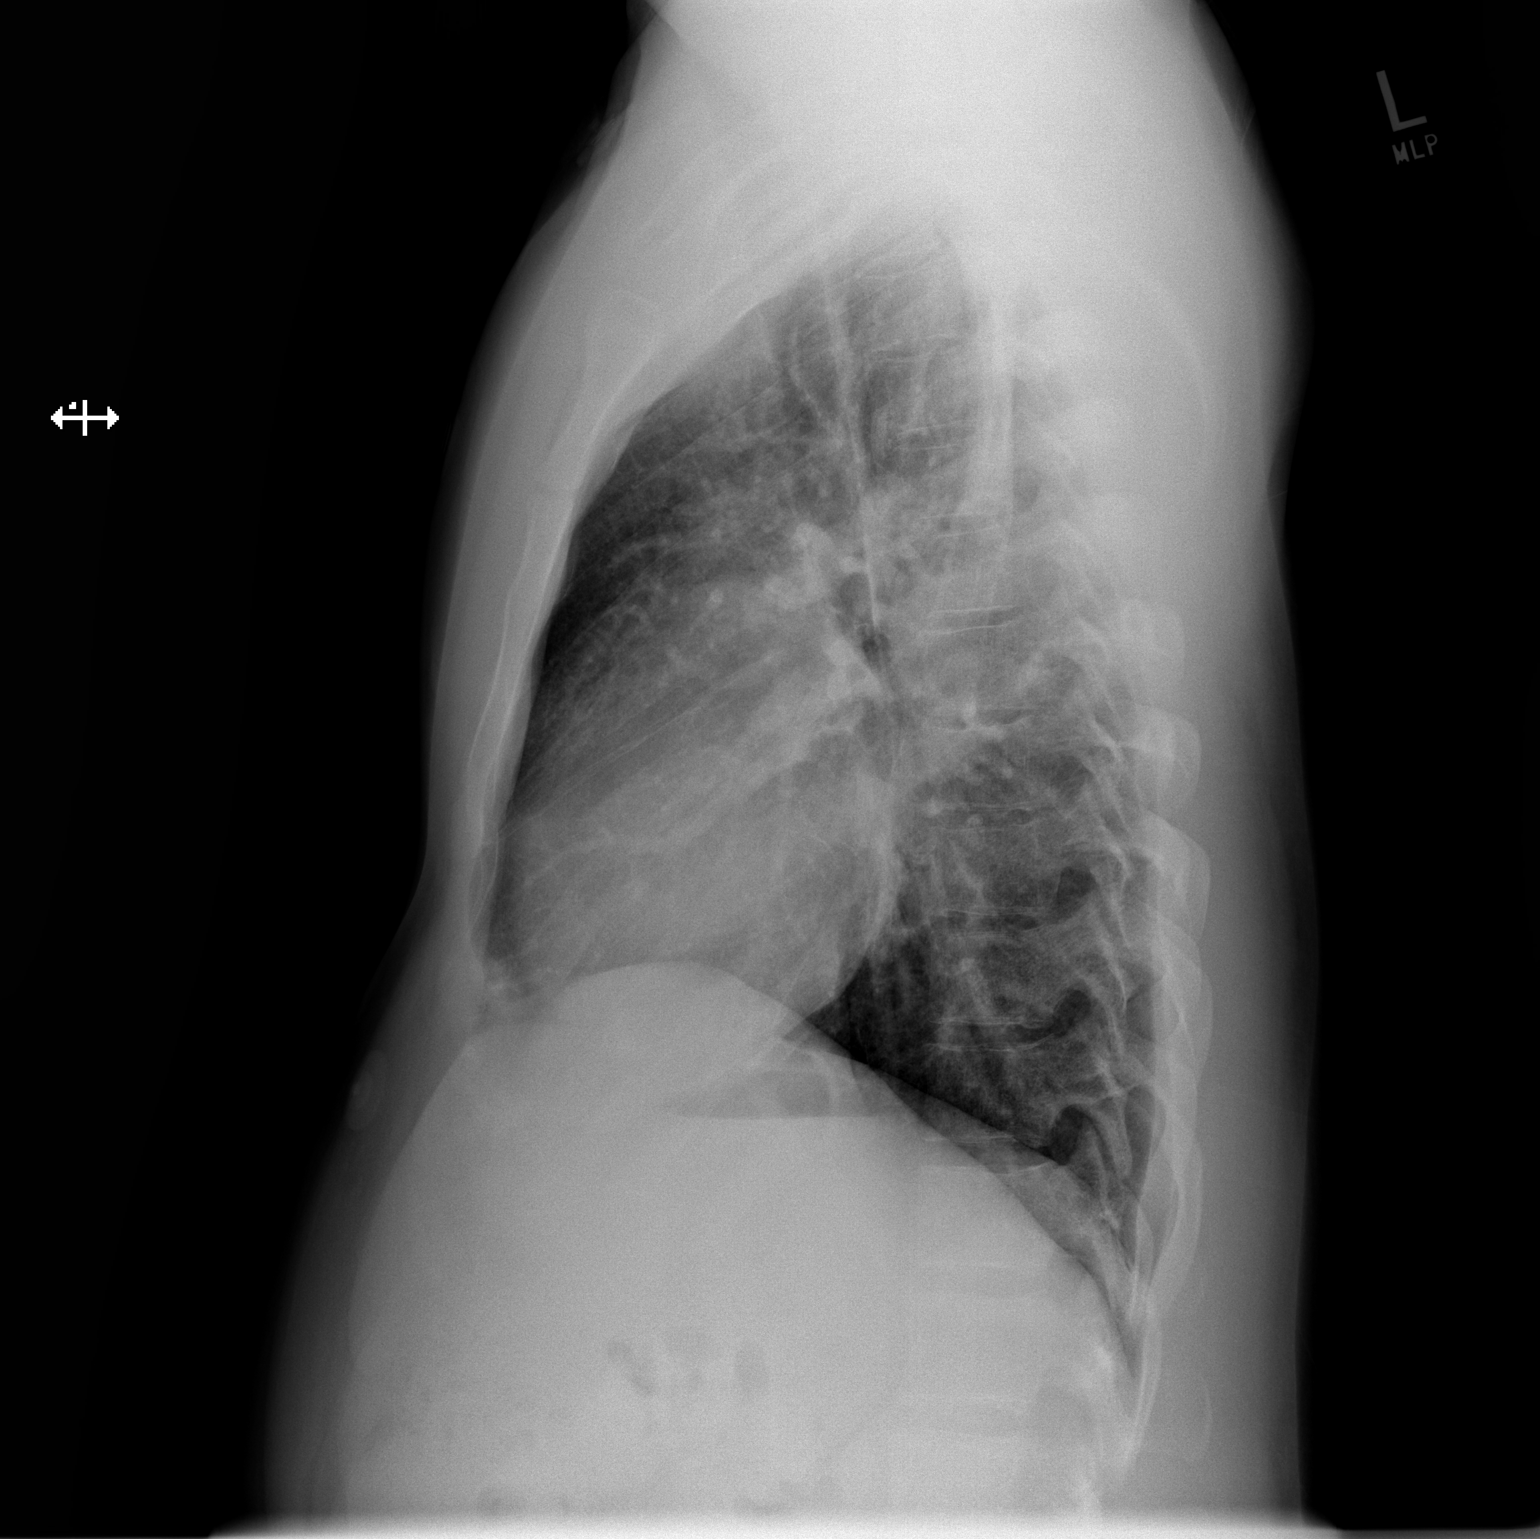

[2 of 2 positions shown; findings below may reference images not displayed]

FINDINGS: Stable borderline cardiomegaly. Normal pulmonary vascularity. No
focal consolidation, pleural effusion, or pneumothorax. No acute
osseous abnormality.
IMPRESSION: No active cardiopulmonary disease.

## 2020-11-08 ENCOUNTER — Encounter (HOSPITAL_COMMUNITY): Payer: Self-pay | Admitting: *Deleted

## 2020-11-08 ENCOUNTER — Emergency Department (HOSPITAL_COMMUNITY)
Admission: EM | Admit: 2020-11-08 | Discharge: 2020-11-08 | Disposition: A | Discharge status: Home / Self Care | Payer: Managed Care, Other (non HMO) | Attending: Emergency Medicine | Admitting: Emergency Medicine

## 2020-11-08 ENCOUNTER — Other Ambulatory Visit: Payer: Self-pay

## 2020-11-08 DIAGNOSIS — Z20822 Contact with and (suspected) exposure to covid-19: Secondary | ICD-10-CM

## 2020-11-08 DIAGNOSIS — Z28310 Unvaccinated for covid-19: Secondary | ICD-10-CM | POA: Diagnosis not present

## 2020-11-08 DIAGNOSIS — U071 COVID-19: Secondary | ICD-10-CM | POA: Diagnosis not present

## 2020-11-08 DIAGNOSIS — Z87891 Personal history of nicotine dependence: Secondary | ICD-10-CM | POA: Insufficient documentation

## 2020-11-08 DIAGNOSIS — R059 Cough, unspecified: Secondary | ICD-10-CM | POA: Diagnosis present

## 2020-11-08 DIAGNOSIS — I10 Essential (primary) hypertension: Secondary | ICD-10-CM | POA: Diagnosis not present

## 2020-11-08 LAB — SARS CORONAVIRUS 2 (TAT 6-24 HRS): SARS Coronavirus 2: POSITIVE — AB

## 2020-11-08 NOTE — ED Triage Notes (Signed)
Pt reports girlfriend tested positive for COVID 2 days ago. He felt achy yesterday and had a headache yesterday and is concerned he had it. He started feeling better this morning.

## 2020-11-08 NOTE — ED Provider Notes (Signed)
[facility]-EMERGENCY DEPT Provider Note   CSN: [id number] Arrival date & time: 11/08/20  0844     History Chief Complaint  Patient presents with  . Covid Exposure    Derek Page is a 30 y.o. male.  HPI      Derek Page is a 30 y.o. male, with a history of HTN, presenting to the ED with request for COVID-19 testing due to exposure from his significant other.  Endorses a "small cough," body aches, fatigue beginning yesterday. No COVID vaccination. Denies fever, shortness of breath, chest pain, abdominal pain, N/V/D, or any other complaints.  Past Medical History:  Diagnosis Date  . Hypertension     There are no problems to display for this patient.   Past Surgical History:  Procedure Laterality Date  . ANKLE SURGERY         Family History  Problem Relation Age of Onset  . Hypertension Mother     Social History   Tobacco Use  . Smoking status: Former Smoker    Packs/day: 0.50    Types: Cigarettes    Quit date: 06/30/2017    Years since quitting: 3.3  . Smokeless tobacco: Never Used  Vaping Use  . Vaping Use: Never used  Substance Use Topics  . Alcohol use: Yes    Comment: occasionally- every other weekend  . Drug use: No    Home Medications Prior to Admission medications   Not on File    Allergies    Patient has no known allergies.  Review of Systems   Review of Systems  Constitutional: Negative for chills and fever.  Respiratory: Positive for cough. Negative for shortness of breath.   Gastrointestinal: Negative for abdominal pain, diarrhea, nausea and vomiting.  Musculoskeletal: Positive for myalgias. Negative for neck pain and neck stiffness.  Neurological: Negative for dizziness, syncope and weakness.  All other systems reviewed and are negative.   Physical Exam Updated Vital Signs BP (!) 139/106 (BP Location: Left Arm)   Pulse 91   Temp 98.4 F (36.9 C) (Oral)   Resp 18   SpO2 95%   Physical  Exam Vitals and nursing note reviewed.  Constitutional:      General: He is not in acute distress.    Appearance: He is well-developed. He is not diaphoretic.  HENT:     Head: Normocephalic and atraumatic.     Mouth/Throat:     Mouth: Mucous membranes are moist.     Pharynx: Oropharynx is clear.  Eyes:     Conjunctiva/sclera: Conjunctivae normal.  Cardiovascular:     Rate and Rhythm: Normal rate and regular rhythm.     Pulses: Normal pulses.          Radial pulses are 2+ on the right side and 2+ on the left side.     Heart sounds: Normal heart sounds.     Comments: Tactile temperature in the extremities appropriate and equal bilaterally. Pulmonary:     Effort: Pulmonary effort is normal. No respiratory distress.     Breath sounds: Normal breath sounds.  Abdominal:     Tenderness: There is no guarding.  Musculoskeletal:     Cervical back: Neck supple.  Skin:    General: Skin is warm and dry.  Neurological:     Mental Status: He is alert.  Psychiatric:        Mood and Affect: Mood and affect normal.        Speech: Speech normal.  Behavior: Behavior normal.     ED Results / Procedures / Treatments   Labs (all labs ordered are listed, but only abnormal results are displayed) Labs Reviewed  SARS CORONAVIRUS 2 (TAT 6-24 HRS)    EKG None  Radiology No results found.  Procedures Procedures   Medications Ordered in ED Medications - No data to display  ED Course  I have reviewed the triage vital signs and the nursing notes.  Pertinent labs & imaging results that were available during my care of the patient were reviewed by me and considered in my medical decision making (see chart for details).    MDM Rules/Calculators/A&P                          Patient presents for COVID-19 testing due to exposure.  Began having mild symptoms yesterday. Patient is nontoxic appearing, afebrile, not tachycardic, not tachypneic, not hypotensive, excellent SPO2 on room air,  and is in no apparent distress.      Final Clinical Impression(s) / ED Diagnoses Final diagnoses:  Person under investigation for COVID-19    Rx / DC Orders ED Discharge Orders    None       Derek Page 11/08/20 9450    Derek Sleeper, MD 11/08/20 1759

## 2020-11-08 NOTE — Discharge Instructions (Signed)
Test Results for COVID-19 pending  You have a test pending for COVID-19.  Results typically return within about 48 hours.  Be sure to check MyChart for updated results.  We recommend isolating yourself until results are received.  Patients who have symptoms consistent with COVID-19 should self isolated for: At least 3 days (72 hours) have passed since recovery, defined as resolution of fever without the use of fever reducing medications and improvement in respiratory symptoms (e.g., cough, shortness of breath), and At least 7 days have passed since symptoms first appeared.  If you have no symptoms, but your test returns positive, recommend isolating for at least 10 days.    COVID-19 Home Management:  COVID-19 is caused by a virus. Viruses do not require or respond to antibiotics. Treatment is symptomatic care and it is important to note that these symptoms may last for 7-14 days.   Hand washing: Wash your hands throughout the day, but especially before and after touching the face, using the restroom, sneezing, coughing, or touching surfaces that have been coughed or sneezed upon. Hydration: Symptoms of most illnesses will be intensified and complicated by dehydration. Dehydration can also extend the duration of symptoms. Drink plenty of fluids and get plenty of rest. You should be drinking at least half a liter of water an hour to stay hydrated. Electrolyte drinks (ex. Gatorade, Powerade, Pedialyte) are also encouraged. You should be drinking enough fluids to make your urine light yellow, almost clear. If this is not the case, you are not drinking enough water. Please note that some of the treatments indicated below will not be effective if you are not adequately hydrated. Diet: Please concentrate on hydration, however, you may introduce food slowly.  Start with a clear liquid diet, progressed to a full liquid diet, and then bland solids as you are able. Pain or fever: Ibuprofen, Naproxen, or  acetaminophen (generic for Tylenol) for pain or fever.  Antiinflammatory medications: Take 600 mg of ibuprofen every 6 hours or 440 mg (over the counter dose) to 500 mg (prescription dose) of naproxen every 12 hours for the next 3 days. After this time, these medications may be used as needed for pain. Take these medications with food to avoid upset stomach. Choose only one of these medications, do not take them together. Acetaminophen (generic for Tylenol): Should you continue to have additional pain while taking the ibuprofen or naproxen, you may add in acetaminophen as needed. Your daily total maximum amount of acetaminophen from all sources should be limited to 4000mg /day for persons without liver problems, or 2000mg /day for those with liver problems. Cough: Teas, warm liquids, broths, and honey can help with cough. Zyrtec or Claritin: May add these medication daily to control underlying symptoms of congestion, sneezing, and other signs of allergies.  These medications are available over-the-counter. Generics: Cetirizine (generic for Zyrtec) and loratadine (generic for Claritin). Fluticasone: Use fluticasone (generic for Flonase), as directed, for nasal and sinus congestion.  This medication is available over-the-counter. Congestion: Plain guaifenesin (generic for plain Mucinex) may help relieve congestion. Saline sinus rinses and saline nasal sprays may also help relieve congestion.  Sore throat: Warm liquids or Chloraseptic spray may help soothe a sore throat. Gargle twice a day with a salt water solution made from a half teaspoon of salt in a cup of warm water.  Follow up: Follow up with a primary care provider within the next two weeks should symptoms fail to resolve. Return: Return to the ED for significantly worsening symptoms,  shortness of breath, persistent/worsening chest pain, persistent vomiting, large amounts of blood in stool, worsening/localized abdominal pain, or any other major  concerns.  For prescription assistance, may try using prescription discount sites or apps, such as goodrx.com

## 2020-11-09 ENCOUNTER — Telehealth: Payer: Self-pay | Admitting: Physician Assistant

## 2020-11-09 NOTE — Telephone Encounter (Signed)
Called to discuss with patient about COVID-19 symptoms and the use of one of the available treatments for those with mild to moderate Covid symptoms and at a high risk of hospitalization.  Pt appears to qualify for outpatient treatment due to co-morbid conditions and/or a member of an at-risk group in accordance with the FDA Emergency Use Authorization.   Unable to reach pt. LVMTCB.  Deepa Barthel   

## 2022-04-17 ENCOUNTER — Emergency Department (HOSPITAL_COMMUNITY)
Admission: EM | Admit: 2022-04-17 | Discharge: 2022-04-18 | Disposition: A | Discharge status: Home / Self Care | Payer: Managed Care, Other (non HMO) | Attending: Emergency Medicine | Admitting: Emergency Medicine

## 2022-04-17 ENCOUNTER — Encounter (HOSPITAL_COMMUNITY): Payer: Self-pay | Admitting: Emergency Medicine

## 2022-04-17 ENCOUNTER — Other Ambulatory Visit: Payer: Self-pay

## 2022-04-17 DIAGNOSIS — R519 Headache, unspecified: Secondary | ICD-10-CM | POA: Insufficient documentation

## 2022-04-17 NOTE — ED Triage Notes (Signed)
Pt c/o headche, head and neck pain since last week.

## 2022-04-18 ENCOUNTER — Emergency Department (HOSPITAL_COMMUNITY): Payer: Managed Care, Other (non HMO)

## 2022-04-18 MED ORDER — DIPHENHYDRAMINE HCL 25 MG PO CAPS
25.0000 mg | ORAL_CAPSULE | Freq: Once | ORAL | Status: DC
  Filled 2022-04-18: qty 1

## 2022-04-18 MED ORDER — CYCLOBENZAPRINE HCL 10 MG PO TABS: 10.0000 mg | ORAL_TABLET | Freq: Two times a day (BID) | ORAL | 0 refills | Status: DC | PRN

## 2022-04-18 MED ORDER — METOCLOPRAMIDE HCL 5 MG/ML IJ SOLN
10.0000 mg | Freq: Once | INTRAMUSCULAR | Status: DC
  Filled 2022-04-18: qty 2

## 2022-04-18 MED ORDER — KETOROLAC TROMETHAMINE 15 MG/ML IJ SOLN
15.0000 mg | Freq: Once | INTRAMUSCULAR | Status: DC
  Filled 2022-04-18: qty 1

## 2022-04-18 MED ORDER — ACETAMINOPHEN 325 MG PO TABS
650.0000 mg | ORAL_TABLET | Freq: Once | ORAL | Status: AC
  Administered 2022-04-18: 650 mg via ORAL
  Filled 2022-04-18: qty 2

## 2022-04-18 NOTE — ED Notes (Signed)
Pt transported to CT ?

## 2022-04-18 NOTE — Discharge Instructions (Addendum)
Exam and imaging are reassuring, I recommend over-the-counter pain medication as needed, please get plenty of sleep stay hydrated decrease stress to help decrease frequency of headaches.  I have given you a muscle relaxer take as prescribed can make you drowsy do not consume alcohol or operate heavy machinery while taking this medication.  Please follow-up with your PCP and/or neurologist for further evaluation.  Come back to the emergency department if you develop chest pain, shortness of breath, severe abdominal pain, uncontrolled nausea, vomiting, diarrhea.

## 2022-04-18 NOTE — ED Notes (Signed)
Pt refused headache cocktail, states he does not like to take medication and has not attempted anything for his headache.

## 2022-04-18 NOTE — ED Provider Notes (Signed)
[facility] DEPT Provider Note   CSN: [id number] Arrival date & time: 04/17/22  2023     History  Chief Complaint  Patient presents with   Headache    Derek Page is a 31 y.o. male.  HPI   Patient not significant medical history resents with complaints of a headache, states been going on for about a week's time, states he feels it on the right side of his head, states it was constant, came on gradually, not the worst headache of his life, no change in vision there is no paresthesias or weakness to upper or lower extremities, no recent head trauma, not anticoag, not endorsing any fevers chills, no history of IV drug use, states that he is never had a headache like this past, not take anything for pain at this time.    Home Medications Prior to Admission medications   Medication Sig Start Date End Date Taking? Authorizing Provider  cyclobenzaprine (FLEXERIL) 10 MG tablet Take 1 tablet (10 mg total) by mouth 2 (two) times daily as needed for muscle spasms. 04/18/22  Yes Marcello Fennel, PA-C      Allergies    Patient has no known allergies.    Review of Systems   Review of Systems  Constitutional:  Negative for chills and fever.  Respiratory:  Negative for shortness of breath.   Cardiovascular:  Negative for chest pain.  Gastrointestinal:  Negative for abdominal pain.  Neurological:  Positive for headaches.    Physical Exam Updated Vital Signs BP 130/88   Pulse 64   Temp 97.8 F (36.6 C) (Oral)   Resp 16   Ht 5\' 8"  (1.727 m)   Wt 76.7 kg   SpO2 97%   BMI 25.71 kg/m  Physical Exam Vitals and nursing note reviewed.  Constitutional:      General: He is not in acute distress.    Appearance: He is not ill-appearing.  HENT:     Head: Normocephalic and atraumatic.     Nose: No congestion.  Eyes:     Conjunctiva/sclera: Conjunctivae normal.  Cardiovascular:     Rate and Rhythm: Normal rate and regular rhythm.     Pulses:  Normal pulses.  Pulmonary:     Effort: Pulmonary effort is normal.  Musculoskeletal:     Comments: Spine was palpated was nontender to palpation no step-off or deformities noted, there is no overlying skin changes.  Skin:    General: Skin is warm and dry.  Neurological:     Mental Status: He is alert.     GCS: GCS eye subscore is 4. GCS verbal subscore is 5. GCS motor subscore is 6.     Cranial Nerves: Cranial nerves 2-12 are intact.     Sensory: Sensation is intact.     Motor: No weakness.     Coordination: Romberg sign negative. Finger-Nose-Finger Test and Heel to Wortham Test normal.     Comments: Cranial nerves II through XII grossly intact no difficulty with word finding following two-step commands there is no unilateral weakness present during my examination.  Psychiatric:        Mood and Affect: Mood normal.     ED Results / Procedures / Treatments   Labs (all labs ordered are listed, but only abnormal results are displayed) Labs Reviewed - No data to display  EKG None  Radiology CT Head Wo Contrast  Result Date: 04/18/2022 CLINICAL DATA:  Chronic headaches EXAM: CT HEAD WITHOUT CONTRAST TECHNIQUE: Contiguous axial  images were obtained from the base of the skull through the vertex without intravenous contrast. RADIATION DOSE REDUCTION: This exam was performed according to the departmental dose-optimization program which includes automated exposure control, adjustment of the mA and/or kV according to patient size and/or use of iterative reconstruction technique. COMPARISON:  None Available. FINDINGS: Brain: No evidence of acute infarction, hemorrhage, hydrocephalus, extra-axial collection or mass lesion/mass effect. Vascular: No hyperdense vessel or unexpected calcification. Skull: Normal. Negative for fracture or focal lesion. Sinuses/Orbits: No acute finding. Other: None. IMPRESSION: No acute intracranial abnormality noted. Electronically Signed   By: Alcide Clever M.D.   On:  04/18/2022 03:30    Procedures Procedures    Medications Ordered in ED Medications  acetaminophen (TYLENOL) tablet 650 mg (650 mg Oral Given 04/18/22 0335)    ED Course/ Medical Decision Making/ A&P                           Medical Decision Making Amount and/or Complexity of Data Reviewed Radiology: ordered.  Risk OTC drugs. Prescription drug management.   This patient presents to the ED for concern of headaches, this involves an extensive number of treatment options, and is a complaint that carries with it a high risk of complications and morbidity.  The differential diagnosis includes intracranial bleed, CVA, cranial mass    Additional history obtained:  Additional history obtained from N/A External records from outside source obtained and reviewed including internal medicine notes   Co morbidities that complicate the patient evaluation  N/a  Social Determinants of Health:  N/a    Lab Tests:  I Ordered, and personally interpreted labs.  The pertinent results include:  n/a   Imaging Studies ordered:  I ordered imaging studies including CT head I independently visualized and interpreted imaging which showed negative acute findings I agree with the radiologist interpretation   Cardiac Monitoring:  The patient was maintained on a cardiac monitor.  I personally viewed and interpreted the cardiac monitored which showed an underlying rhythm of: N/A   Medicines ordered and prescription drug management:  I ordered medication including Tylenol I have reviewed the patients home medicines and have made adjustments as needed  Critical Interventions:  N/A   Reevaluation:  Presents with a tension-like headache, will provide with a migraine cocktail and order CT head to rule out possible cranial mass  Patient deferred on migraine cocktail just wanted Tylenol, updated on imaging he has no complaints she is agreement with discharge at this  time.  Consultations Obtained:  N/a    Test Considered:  N/a    Rule out low suspicion for internal head bleed and or mass as CT imaging is negative for acute findings.  Low suspicion for CVA she has no focal deficit present my exam.  Low suspicion for dissection of the vertebral or carotid artery as presentation atypical of etiology.  Low suspicion for meningitis as she has no meningeal sign present.     Dispostion and problem list  After consideration of the diagnostic results and the patients response to treatment, I feel that the patent would benefit from discharge.  Headache-suspect tension-like headache will recommend over-the-counter pain medication follow-up with neurology/PCP for further assessment.            Final Clinical Impression(s) / ED Diagnoses Final diagnoses:  Bad headache    Rx / DC Orders ED Discharge Orders          Ordered  cyclobenzaprine (FLEXERIL) 10 MG tablet  2 times daily PRN        04/18/22 0359              Carroll Sage, PA-C 04/18/22 0400    Zadie Rhine, MD 04/18/22 [phone]

## 2022-12-07 ENCOUNTER — Emergency Department (HOSPITAL_COMMUNITY): Payer: Managed Care, Other (non HMO)

## 2022-12-07 ENCOUNTER — Other Ambulatory Visit: Payer: Self-pay

## 2022-12-07 ENCOUNTER — Emergency Department (HOSPITAL_COMMUNITY)
Admission: EM | Admit: 2022-12-07 | Discharge: 2022-12-07 | Disposition: A | Discharge status: Home / Self Care | Payer: Managed Care, Other (non HMO) | Attending: Emergency Medicine | Admitting: Emergency Medicine

## 2022-12-07 DIAGNOSIS — S40212A Abrasion of left shoulder, initial encounter: Secondary | ICD-10-CM | POA: Insufficient documentation

## 2022-12-07 DIAGNOSIS — D72829 Elevated white blood cell count, unspecified: Secondary | ICD-10-CM | POA: Diagnosis not present

## 2022-12-07 DIAGNOSIS — Y906 Blood alcohol level of 120-199 mg/100 ml: Secondary | ICD-10-CM | POA: Insufficient documentation

## 2022-12-07 DIAGNOSIS — F1090 Alcohol use, unspecified, uncomplicated: Secondary | ICD-10-CM | POA: Insufficient documentation

## 2022-12-07 DIAGNOSIS — R319 Hematuria, unspecified: Secondary | ICD-10-CM

## 2022-12-07 DIAGNOSIS — S0101XA Laceration without foreign body of scalp, initial encounter: Secondary | ICD-10-CM

## 2022-12-07 DIAGNOSIS — I10 Essential (primary) hypertension: Secondary | ICD-10-CM | POA: Diagnosis not present

## 2022-12-07 DIAGNOSIS — Y92410 Unspecified street and highway as the place of occurrence of the external cause: Secondary | ICD-10-CM | POA: Diagnosis not present

## 2022-12-07 DIAGNOSIS — Z23 Encounter for immunization: Secondary | ICD-10-CM | POA: Insufficient documentation

## 2022-12-07 DIAGNOSIS — F419 Anxiety disorder, unspecified: Secondary | ICD-10-CM | POA: Diagnosis not present

## 2022-12-07 DIAGNOSIS — Z789 Other specified health status: Secondary | ICD-10-CM

## 2022-12-07 DIAGNOSIS — S32009A Unspecified fracture of unspecified lumbar vertebra, initial encounter for closed fracture: Secondary | ICD-10-CM | POA: Insufficient documentation

## 2022-12-07 DIAGNOSIS — M545 Low back pain, unspecified: Secondary | ICD-10-CM | POA: Diagnosis present

## 2022-12-07 LAB — URINALYSIS, ROUTINE W REFLEX MICROSCOPIC
Bilirubin Urine: NEGATIVE
Glucose, UA: NEGATIVE mg/dL
Ketones, ur: NEGATIVE mg/dL
Leukocytes,Ua: NEGATIVE
Nitrite: NEGATIVE
Protein, ur: 100 mg/dL — AB
RBC / HPF: >50 RBC/hpf (ref 0–5)
Specific Gravity, Urine: 1.009 (ref 1.005–1.030)
pH: 5 (ref 5.0–8.0)

## 2022-12-07 LAB — COMPREHENSIVE METABOLIC PANEL
ALT: 50 U/L — ABNORMAL HIGH (ref 0–44)
AST: 50 U/L — ABNORMAL HIGH (ref 15–41)
Albumin: 4.3 g/dL (ref 3.5–5.0)
Alkaline Phosphatase: 73 U/L (ref 38–126)
Anion gap: 15 (ref 5–15)
BUN: 9 mg/dL (ref 6–20)
CO2: 26 mmol/L (ref 22–32)
Calcium: 9 mg/dL (ref 8.9–10.3)
Chloride: 101 mmol/L (ref 98–111)
Creatinine, Ser: 1.03 mg/dL (ref 0.61–1.24)
GFR, Estimated: >60 mL/min (ref >60)
Glucose, Bld: 135 mg/dL — ABNORMAL HIGH (ref 70–99)
Potassium: 4 mmol/L (ref 3.5–5.1)
Sodium: 142 mmol/L (ref 135–145)
Total Bilirubin: 1.5 mg/dL — ABNORMAL HIGH (ref 0.3–1.2)
Total Protein: 7.2 g/dL (ref 6.5–8.1)

## 2022-12-07 LAB — LACTIC ACID, PLASMA: Lactic Acid, Venous: 2.2 mmol/L (ref 0.5–1.9)

## 2022-12-07 LAB — I-STAT CHEM 8, ED
BUN: 10 mg/dL (ref 6–20)
Calcium, Ion: 1.09 mmol/L — ABNORMAL LOW (ref 1.15–1.40)
Chloride: 104 mmol/L (ref 98–111)
Creatinine, Ser: 1.2 mg/dL (ref 0.61–1.24)
Glucose, Bld: 132 mg/dL — ABNORMAL HIGH (ref 70–99)
HCT: 51 % (ref 39.0–52.0)
Hemoglobin: 17.3 g/dL — ABNORMAL HIGH (ref 13.0–17.0)
Potassium: 3.9 mmol/L (ref 3.5–5.1)
Sodium: 143 mmol/L (ref 135–145)
TCO2: 27 mmol/L (ref 22–32)

## 2022-12-07 LAB — CBC
HCT: 49.3 % (ref 39.0–52.0)
Hemoglobin: 16.2 g/dL (ref 13.0–17.0)
MCH: 30.4 pg (ref 26.0–34.0)
MCHC: 32.9 g/dL (ref 30.0–36.0)
MCV: 92.5 fL (ref 80.0–100.0)
Platelets: 239 10*3/uL (ref 150–400)
RBC: 5.33 MIL/uL (ref 4.22–5.81)
RDW: 13.1 % (ref 11.5–15.5)
WBC: 11 10*3/uL — ABNORMAL HIGH (ref 4.0–10.5)
nRBC: 0 % (ref 0.0–0.2)

## 2022-12-07 LAB — PROTIME-INR
INR: 1 (ref 0.8–1.2)
Prothrombin Time: 13.7 seconds (ref 11.4–15.2)

## 2022-12-07 LAB — ETHANOL: Alcohol, Ethyl (B): 178 mg/dL — ABNORMAL HIGH (ref <10)

## 2022-12-07 MED ORDER — NAPROXEN 500 MG PO TABS: 500.0000 mg | ORAL_TABLET | Freq: Two times a day (BID) | ORAL | 0 refills | Status: AC

## 2022-12-07 MED ORDER — IOHEXOL 350 MG/ML SOLN
75.0000 mL | Freq: Once | INTRAVENOUS | Status: AC | PRN
  Administered 2022-12-07: 75 mL via INTRAVENOUS

## 2022-12-07 MED ORDER — TETANUS-DIPHTH-ACELL PERTUSSIS 5-2.5-18.5 LF-MCG/0.5 IM SUSY
0.5000 mL | PREFILLED_SYRINGE | Freq: Once | INTRAMUSCULAR | Status: AC
  Administered 2022-12-07: 0.5 mL via INTRAMUSCULAR
  Filled 2022-12-07: qty 0.5

## 2022-12-07 MED ORDER — FENTANYL CITRATE PF 50 MCG/ML IJ SOSY: 50.0000 ug | PREFILLED_SYRINGE | Freq: Once | INTRAMUSCULAR | Status: DC

## 2022-12-07 MED ORDER — KETOROLAC TROMETHAMINE 15 MG/ML IJ SOLN
15.0000 mg | Freq: Once | INTRAMUSCULAR | Status: AC
  Administered 2022-12-07: 15 mg via INTRAVENOUS
  Filled 2022-12-07: qty 1

## 2022-12-07 MED ORDER — METHOCARBAMOL 500 MG PO TABS: 500.0000 mg | ORAL_TABLET | Freq: Two times a day (BID) | ORAL | 0 refills | Status: AC | PRN

## 2022-12-07 MED ORDER — METHOCARBAMOL 1000 MG/10ML IJ SOLN
500.0000 mg | Freq: Once | INTRAVENOUS | Status: AC
  Administered 2022-12-07: 500 mg via INTRAVENOUS
  Filled 2022-12-07: qty 500
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
## 2022-12-07 NOTE — Discharge Instructions (Addendum)
Wear a lumbar brace for support of your spinal fractures. Alternate ice and heat to areas of injury 3-4 times per day to limit inflammation and spasm.  Avoid strenuous activity and heavy lifting.  We recommend consistent use of naproxen in addition to Robaxin for muscle spasms.  Do not drive or drink alcohol after taking Robaxin as it may make you drowsy and impair your judgment.  We recommend follow-up with neurosurgery to ensure proper healing of your broken bones. You were found to have some blood in your urine. You should have a repeat urine test done in 1 week to ensure this is clearing/resolving.  Have your staples removed in 1 week as well. Return to the ED for any new or concerning symptoms.

## 2022-12-07 NOTE — ED Notes (Signed)
RN notified Ortho to bring brace for pt

## 2022-12-07 NOTE — ED Notes (Signed)
  Pt spouse assisting pt with clothing. Ortho tech provided education on wearing back brace. RN reviewed d/c paperwork and pt verbalized understanding. No additional questions at this time.

## 2022-12-07 NOTE — ED Notes (Addendum)
EMS Ccollar exchanged for [employer]

## 2022-12-07 NOTE — ED Notes (Signed)
3 inch laceration noted to back of head. Pt resistant to care, but allowed PA to staple laceration with one staple in laceration. Pt endorsing pain in lower back and left hip, but refused medication for pain.

## 2022-12-07 NOTE — ED Notes (Signed)
Pt tearful and has been defiant with staff. Family at bedside for emotional support.

## 2022-12-07 NOTE — Progress Notes (Signed)
Orthopedic Tech Progress Note Patient Details:  Derek Page [birth year] [id number]  Ortho Devices Type of Ortho Device: Lumbar corsett Ortho Device/Splint Interventions: Ordered, Application, Adjustment   Post Interventions Patient Tolerated: Well Instructions Provided: Care of device, Adjustment of device  [place] A [name] 12/07/2022, 10:24 AM

## 2022-12-07 NOTE — ED Notes (Addendum)
Pt declined medication at this time

## 2022-12-07 NOTE — ED Notes (Signed)
Pt declined to finish the scan and no contrast.  RN notified EDP for next steps.

## 2022-12-07 NOTE — ED Notes (Signed)
Pt agreed to receive medication and requested to get the staples.  Pt requested beverage

## 2024-01-06 ENCOUNTER — Emergency Department (HOSPITAL_COMMUNITY)
Admission: EM | Admit: 2024-01-06 | Discharge: 2024-01-06 | Disposition: A | Discharge status: Home / Self Care | Attending: Emergency Medicine | Admitting: Emergency Medicine

## 2024-01-06 ENCOUNTER — Other Ambulatory Visit: Payer: Self-pay

## 2024-01-06 ENCOUNTER — Encounter (HOSPITAL_COMMUNITY): Payer: Self-pay | Admitting: Emergency Medicine

## 2024-01-06 ENCOUNTER — Emergency Department (HOSPITAL_COMMUNITY)

## 2024-01-06 DIAGNOSIS — R079 Chest pain, unspecified: Secondary | ICD-10-CM | POA: Diagnosis present

## 2024-01-06 LAB — CBC
HCT: 45.8 % (ref 39.0–52.0)
Hemoglobin: 15.1 g/dL (ref 13.0–17.0)
MCH: 30.9 pg (ref 26.0–34.0)
MCHC: 33 g/dL (ref 30.0–36.0)
MCV: 93.7 fL (ref 80.0–100.0)
Platelets: 234 K/uL (ref 150–400)
RBC: 4.89 MIL/uL (ref 4.22–5.81)
RDW: 12.8 % (ref 11.5–15.5)
WBC: 8.8 K/uL (ref 4.0–10.5)
nRBC: 0 % (ref 0.0–0.2)

## 2024-01-06 LAB — TROPONIN I (HIGH SENSITIVITY): Troponin I (High Sensitivity): 4 ng/L (ref <18)

## 2024-01-06 LAB — BASIC METABOLIC PANEL WITH GFR
Anion gap: 8 (ref 5–15)
BUN: 13 mg/dL (ref 6–20)
CO2: 24 mmol/L (ref 22–32)
Calcium: 9.2 mg/dL (ref 8.9–10.3)
Chloride: 104 mmol/L (ref 98–111)
Creatinine, Ser: 0.76 mg/dL (ref 0.61–1.24)
GFR, Estimated: >60 mL/min (ref >60)
Glucose, Bld: 100 mg/dL — ABNORMAL HIGH (ref 70–99)
Potassium: 4 mmol/L (ref 3.5–5.1)
Sodium: 136 mmol/L (ref 135–145)

## 2024-01-06 NOTE — ED Triage Notes (Signed)
 Patient c/o chest pain x 30 mins. Patient report his just sitting in the couch when chest pain started. Patient denies SOB. Patient denies N/V.

## 2024-01-06 NOTE — ED Provider Notes (Signed)
 [place] [facility] AT [facility] Provider Note   CSN: [id number] Arrival date & time: 01/06/24  8042     Patient presents with: Chest Pain   Derek Page is a 33 y.o. male.   Patient here after brief episode of left chest wall pain now resolved.  Denies any weakness numbness tingling.  No shortness of breath.  No recent surgery or travel.  History of high blood pressure.  Denies any fever chills cough or sputum production.  No exertional symptoms.  Not sure if symptoms related to eating.  No abdominal pain.  The history is provided by the patient.       Prior to Admission medications   Medication Sig Start Date End Date Taking? Authorizing Provider  methocarbamol  (ROBAXIN ) 500 MG tablet Take 1 tablet (500 mg total) by mouth every 12 (twelve) hours as needed for muscle spasms. 12/07/22   Keith Sor, PA-C  naproxen  (NAPROSYN ) 500 MG tablet Take 1 tablet (500 mg total) by mouth 2 (two) times daily. 12/07/22   Keith Sor, PA-C    Allergies: Patient has no known allergies.    Review of Systems  Updated Vital Signs BP (!) 168/117 (BP Location: Left Arm)   Pulse 84   Temp 98.1 F (36.7 C) (Oral)   Resp 18   SpO2 98%   Physical Exam Vitals and nursing note reviewed.  Constitutional:      General: He is not in acute distress.    Appearance: He is well-developed. He is not ill-appearing.  HENT:     Head: Normocephalic and atraumatic.  Eyes:     Extraocular Movements: Extraocular movements intact.     Conjunctiva/sclera: Conjunctivae normal.     Pupils: Pupils are equal, round, and reactive to light.  Cardiovascular:     Rate and Rhythm: Normal rate and regular rhythm.     Pulses:          Radial pulses are 2+ on the right side and 2+ on the left side.     Heart sounds: Normal heart sounds. No murmur heard. Pulmonary:     Effort: Pulmonary effort is normal. No respiratory distress.     Breath sounds: Normal breath sounds. No decreased breath  sounds.  Abdominal:     Palpations: Abdomen is soft.     Tenderness: There is no abdominal tenderness.  Musculoskeletal:        General: No swelling. Normal range of motion.     Cervical back: Normal range of motion and neck supple.  Skin:    General: Skin is warm and dry.     Capillary Refill: Capillary refill takes less than 2 seconds.  Neurological:     Mental Status: He is alert.  Psychiatric:        Mood and Affect: Mood normal.     (all labs ordered are listed, but only abnormal results are displayed) Labs Reviewed  BASIC METABOLIC PANEL WITH GFR - Abnormal; Notable for the following components:      Result Value   Glucose, Bld 100 (*)    All other components within normal limits  CBC  TROPONIN I (HIGH SENSITIVITY)    EKG: EKG Interpretation Date/Time:  Wednesday January 06 2024 20:02:04 EDT Ventricular Rate:  80 PR Interval:  151 QRS Duration:  79 QT Interval:  350 QTC Calculation: 404 R Axis:   32  Text Interpretation: Sinus rhythm Confirmed by Ruthe Cornet [phone]) on 01/06/2024 8:13:36 PM  Radiology: [facility] Chest 2 View  Result Date: 01/06/2024 CLINICAL DATA:  Chest pain EXAM: CHEST - 2 VIEW COMPARISON:  12/07/2022 FINDINGS: Upper normal cardiac size. Both lungs are clear. The visualized skeletal structures are unremarkable. IMPRESSION: No active cardiopulmonary disease. Electronically Signed   By: Luke Bun M.D.   On: 01/06/2024 20:26     Procedures   Medications Ordered in the ED - No data to display                                  Medical Decision Making Amount and/or Complexity of Data Reviewed Labs: ordered. Radiology: ordered.   Derek Page is here with chest pain.  Unremarkable vitals.  Mildly hypertensive.  But improved on my evaluation.  He is 140/100.  Seems like he might have a history of hypertension but does not take medicine for it.  Will have him monitor this with the primary care doctor.  He is not having any active chest pain.  He  had brief episode of chest discomfort tonight.  He has not had any exertional symptoms.  He has no other major cardiac risk factors or family history of cardiac disease.  He is PERC negative and doubt PE.  CBC BMP troponin chest x-ray were obtained which were unremarkable per my review and interpretation.  I have no concern for ACS PE or pneumonia.  There is no significant leukocytosis anemia or electrolyte abnormality.  Troponin was normal.  Chest x-ray was unremarkable.  I reviewed interpreted labs and imaging.  EKG shows sinus rhythm.  No ischemic changes per my review interpretation.  Suspect likely GI related pain or MSK related pain.  Recommend Tylenol  ibuprofen  and Tums if needed.  Follow-up with primary care doctor.  Continue to monitor his blood pressure.  Discharged in good condition.  This chart was dictated using voice recognition software.  Despite best efforts to proofread,  errors can occur which can change the documentation meaning.      Final diagnoses:  Nonspecific chest pain    ED Discharge Orders     None          Ruthe Cornet, DO 01/06/24 2147
